# Patient Record
Sex: Male | Born: 1950 | Race: White | Hispanic: No | Marital: Married | Smoking: Never smoker
Health system: Southern US, Community
[De-identification: ages and names within clinical notes are randomized; demographics above are authoritative.]

## PROBLEM LIST (undated history)

## (undated) DIAGNOSIS — D126 Benign neoplasm of colon, unspecified: Secondary | ICD-10-CM

## (undated) DIAGNOSIS — Z8719 Personal history of other diseases of the digestive system: Secondary | ICD-10-CM

## (undated) DIAGNOSIS — E039 Hypothyroidism, unspecified: Secondary | ICD-10-CM

## (undated) DIAGNOSIS — G4733 Obstructive sleep apnea (adult) (pediatric): Secondary | ICD-10-CM

## (undated) DIAGNOSIS — Z9989 Dependence on other enabling machines and devices: Secondary | ICD-10-CM

## (undated) DIAGNOSIS — J309 Allergic rhinitis, unspecified: Secondary | ICD-10-CM

## (undated) DIAGNOSIS — N529 Male erectile dysfunction, unspecified: Secondary | ICD-10-CM

## (undated) DIAGNOSIS — I10 Essential (primary) hypertension: Secondary | ICD-10-CM

## (undated) DIAGNOSIS — K921 Melena: Secondary | ICD-10-CM

## (undated) DIAGNOSIS — G4730 Sleep apnea, unspecified: Secondary | ICD-10-CM

## (undated) DIAGNOSIS — M1990 Unspecified osteoarthritis, unspecified site: Secondary | ICD-10-CM

## (undated) DIAGNOSIS — Z91030 Bee allergy status: Secondary | ICD-10-CM

## (undated) DIAGNOSIS — N390 Urinary tract infection, site not specified: Secondary | ICD-10-CM

## (undated) HISTORY — PX: OTHER SURGICAL HISTORY: SHX169

## (undated) HISTORY — DX: Benign neoplasm of colon, unspecified: D12.6

## (undated) HISTORY — DX: Unspecified osteoarthritis, unspecified site: M19.90

## (undated) HISTORY — DX: Personal history of other diseases of the digestive system: Z87.19

## (undated) HISTORY — DX: Dependence on other enabling machines and devices: Z99.89

## (undated) HISTORY — DX: Essential (primary) hypertension: I10

## (undated) HISTORY — DX: Melena: K92.1

## (undated) HISTORY — PX: HAND SURGERY: SHX662

## (undated) HISTORY — DX: Allergic rhinitis, unspecified: J30.9

## (undated) HISTORY — DX: Obstructive sleep apnea (adult) (pediatric): G47.33

## (undated) HISTORY — DX: Hypothyroidism, unspecified: E03.9

## (undated) HISTORY — DX: Sleep apnea, unspecified: G47.30

## (undated) HISTORY — DX: Male erectile dysfunction, unspecified: N52.9

## (undated) HISTORY — DX: Urinary tract infection, site not specified: N39.0

## (undated) HISTORY — DX: Bee allergy status: Z91.030

---

## 2014-10-26 LAB — PSA: PSA: 1.03

## 2014-10-26 LAB — VITAMIN D 25 HYDROXY (VIT D DEFICIENCY, FRACTURES): Vit D, 25-Hydroxy: 47

## 2014-10-26 LAB — POCT ERYTHROCYTE SEDIMENTATION RATE, NON-AUTOMATED: Sed Rate: 28

## 2016-06-04 LAB — BASIC METABOLIC PANEL
BUN: 10 (ref 4–21)
CREATININE: 0.7 (ref <=1.3)
GLUCOSE: 111
SODIUM: 138 (ref 137–147)

## 2016-06-04 LAB — TSH: TSH: 2.1 (ref <=5.90)

## 2016-06-05 LAB — CBC AND DIFFERENTIAL: WBC: 10.2

## 2017-05-07 ENCOUNTER — Encounter: Payer: Self-pay | Admitting: Family Medicine

## 2017-05-07 ENCOUNTER — Ambulatory Visit (INDEPENDENT_AMBULATORY_CARE_PROVIDER_SITE_OTHER): Payer: Medicare Other | Admitting: Family Medicine

## 2017-05-07 VITALS — BP 132/70 | HR 59 | Temp 98.3°F | Ht 71.0 in | Wt 291.6 lb

## 2017-05-07 DIAGNOSIS — M1990 Unspecified osteoarthritis, unspecified site: Secondary | ICD-10-CM | POA: Diagnosis not present

## 2017-05-07 DIAGNOSIS — N529 Male erectile dysfunction, unspecified: Secondary | ICD-10-CM

## 2017-05-07 DIAGNOSIS — Z91030 Bee allergy status: Secondary | ICD-10-CM

## 2017-05-07 DIAGNOSIS — Z9989 Dependence on other enabling machines and devices: Secondary | ICD-10-CM

## 2017-05-07 DIAGNOSIS — J301 Allergic rhinitis due to pollen: Secondary | ICD-10-CM

## 2017-05-07 DIAGNOSIS — G4733 Obstructive sleep apnea (adult) (pediatric): Secondary | ICD-10-CM | POA: Diagnosis not present

## 2017-05-07 DIAGNOSIS — Z23 Encounter for immunization: Secondary | ICD-10-CM | POA: Diagnosis not present

## 2017-05-07 DIAGNOSIS — M1712 Unilateral primary osteoarthritis, left knee: Secondary | ICD-10-CM | POA: Diagnosis not present

## 2017-05-07 DIAGNOSIS — Z86010 Personal history of colonic polyps: Secondary | ICD-10-CM

## 2017-05-07 DIAGNOSIS — I10 Essential (primary) hypertension: Secondary | ICD-10-CM

## 2017-05-07 DIAGNOSIS — E039 Hypothyroidism, unspecified: Secondary | ICD-10-CM | POA: Insufficient documentation

## 2017-05-07 DIAGNOSIS — H9319 Tinnitus, unspecified ear: Secondary | ICD-10-CM | POA: Insufficient documentation

## 2017-05-07 DIAGNOSIS — J309 Allergic rhinitis, unspecified: Secondary | ICD-10-CM | POA: Insufficient documentation

## 2017-05-07 DIAGNOSIS — G4730 Sleep apnea, unspecified: Secondary | ICD-10-CM | POA: Insufficient documentation

## 2017-05-07 NOTE — Assessment & Plan Note (Signed)
S: has been told multiple times importance of weight loss A/P: we reviewed importance of weight loss once again. Discussed 10 lb weight goal in 6 months. Encouraged need for healthy eating, regular exercise.

## 2017-05-07 NOTE — Patient Instructions (Addendum)
Flu shot before you leave  Sign release of information at the check out desk for last 3 years of labs and office visits from Dr. Jodi Mourning in [place]. In addition- have them specifically request all immunizations, colonoscopy reports, imaging reports.   Get on mychart- send [place] a message with which medicines you need here locally and which pharmacy you want to use  Check in with me by mychart in 3 weeks to see if we have received your records and all info you need. Depending on most recent labs may need repeat labs.   We will see if we can do a nurse visit or if we need you in to see me.   I want you to set a goal of at least 10 lbs off in 6 months when I want to see you back (happy to see you sooner as well).   My 5 to Fitness!  5: fruits and vegetables per day (work on 9 per day if you are at 5) 4: exercise 4-5 times per week for at least 30-45 minutes (walking counts!) 3: meals per day (don't skip breakfast!) 0-1: sweet per day (2 cookies, 1 small cup of ice cream)

## 2017-05-07 NOTE — Assessment & Plan Note (Signed)
S: controlled with regular astelin usage A/P: willing to refill rx if needed at present

## 2017-05-07 NOTE — Assessment & Plan Note (Signed)
S: on very low dose synthroid 25 mcg A/P: get records to see when last TSH checked- likely needs to be rechecked next visit

## 2017-05-07 NOTE — Progress Notes (Signed)
Phone: [phone]  Subjective:  Patient presents today to establish care.  Prior patient in [place]- now moved here. Chief complaint-noted.   See problem oriented charting  The following were reviewed and entered/updated in epic: Past Medical History:  Diagnosis Date  . Allergic rhinitis    astelin  . Arthritis    Hips and shoulders. celebrex 200mg  BID. plans to cut down with medicare costs  . Blood in stool    augmentin  . Erectile dysfunction    cialis 20mg   . Hx of colonic polyp    2014- was told q3 years- needs update. likely adenoma  . Hx of diverticulitis of colon   . Hypertension    lisinopril 10mg   . Hypothyroidism    synthroid 25 mcg  . OSA on CPAP   . UTI (urinary tract infection)    x1- in hospital.   . Yellow jacket sting allergy    epi pens   Patient Active Problem List   Diagnosis Date Noted  . Morbid obesity ([place]) 05/07/2017    Priority: High  . Osteoarthritis of left knee 05/07/2017    Priority: Medium  . OSA on CPAP     Priority: Medium  . Arthritis     Priority: Medium  . Hypertension     Priority: Medium  . Hypothyroidism     Priority: Medium  . Hx of colonic polyp     Priority: Medium  . Tinnitus 05/07/2017    Priority: Low  . Allergic rhinitis     Priority: Low  . Yellow jacket sting allergy     Priority: Low  . Erectile dysfunction     Priority: Low   Past Surgical History:  Procedure Laterality Date  . left foot surgery     fracture related  . left knee surgery     1980    Family History  Problem Relation Age of Onset  . Arthritis Mother   . Colon cancer Mother        late 43s  . Arthritis Sister   . Arthritis Sister   . Heart failure Father   . Parkinson's disease Father   . Colon cancer Maternal Aunt   . Healthy Child     Medications- reviewed and updated Current Outpatient Prescriptions  Medication Sig Dispense Refill  . acetaminophen (TYLENOL) 500 MG tablet Take by mouth as needed.    . ASPIRIN 81 PO Place  81 mg into the right eye daily.    . Azelastine HCl 0.15 % SOLN SPRAY 2 SPRAY BY INTRANASAL ROUTE EVERY DAY IN EACH NOSTRIL  0  . celecoxib (CELEBREX) 200 MG capsule Take 200 mg by mouth 2 (two) times daily.    . Cholecalciferol (GNP VITAMIN D MAXIMUM STRENGTH) 2000 units TABS Take 2,000 Units by mouth 2 (two) times daily.    Marland Kitchen EPINEPHrine (EPIPEN 2-PAK) 0.3 mg/0.3 mL IJ SOAJ injection EpiPen 2-Pak 0.3 mg/0.3 mL injection, auto-injector    . lisinopril (PRINIVIL,ZESTRIL) 10 MG tablet Take 10 mg by mouth daily.  1  . SYNTHROID 25 MCG tablet Take 25 mcg by mouth daily.  0  . tadalafil (CIALIS) 20 MG tablet Take 20 mg by mouth as needed.     Allergies-reviewed and updated Allergies  Allergen Reactions  . Yellow Jacket Venom [Bee Venom] Anaphylaxis  . Augmentin [Amoxicillin-Pot Clavulanate] Other (See Comments)    Bloody stool  . Ibuprofen Other (See Comments)    skaking  . Levaquin [Levofloxacin] Diarrhea  . Oxycodone Nausea And Vomiting  .  Prednisone Other (See Comments)    Altered mental state  . Sudafed [Pseudoephedrine] Rash  . Vioxx [Rofecoxib] Rash    Social History   Social History  . Marital status: Married    Spouse name: N/A  . Number of children: N/A  . Years of education: N/A   Social History Main Topics  . Smoking status: Never Smoker  . Smokeless tobacco: Never Used  . Alcohol use No  . Drug use: No  . Sexual activity: Not Asked   Other Topics Concern  . None   Social History Narrative   Married 1973. 1 child 50 in [place]- will be in [place] still. 75 month old grandbaby 04/2017.       3 years of college. 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     ROS--Full ROS was completed Review of Systems  Constitutional: Negative for chills and fever.  HENT: Positive for tinnitus. Negative for ear discharge and ear pain.   Eyes: Negative for blurred vision and double  vision.  Respiratory: Negative for cough and shortness of breath.   Cardiovascular: Negative for chest pain and palpitations.  Gastrointestinal: Negative for abdominal pain and vomiting.  Genitourinary: Negative for dysuria and urgency.  Musculoskeletal: Positive for joint pain. Negative for back pain and falls.  Skin: Negative for itching and rash.  Neurological: Negative for dizziness and headaches.  Endo/Heme/Allergies: Negative for polydipsia. Does not bruise/bleed easily.  Psychiatric/Behavioral: Negative for hallucinations and substance abuse.   Objective: BP 132/70   Pulse (!) 59   Temp 98.3 F (36.8 C) (Oral)   Ht 5\' 11"  (1.803 m)   Wt 291 lb 9.6 oz (132.3 kg)   SpO2 95%   BMI 40.67 kg/m  Gen: NAD, resting comfortably HEENT: Mucous membranes are moist. Oropharynx normal. TM normal. Eyes: sclera and lids normal, PERRLA Neck: no thyromegaly, no cervical lymphadenopathy CV: RRR no murmurs rubs or gallops. Not bradycardic on my exam Lungs: CTAB no crackles, wheeze, rhonchi Abdomen: soft/nontender/nondistended/normal bowel sounds. No rebound or guarding. 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 Compliant with cpap A/P: due to central etiology- consider referral to Dr. Brett Fairy of neurology locally   Arthritis S: arthritis in Knees, Hips and shoulders. celebrex 200mg  BID. 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  BP Readings from Last 3 Encounters:  05/07/17 132/70  A/P: We discussed blood pressure goal of <140/90. Continue current meds:  Willing to refill   Hypothyroidism S: on very low dose synthroid 25 mcg A/P: get records to see when last TSH checked- likely needs to be rechecked next visit  Hx of colonic polyp 2014- was told q3 years- needs update now. likely adenoma- need records  Morbid obesity ([place]) S: has been told multiple times importance of weight loss A/P: we reviewed  importance of weight loss once again. Discussed 10 lb weight goal in 6 months. Encouraged need for healthy eating, regular exercise.    Future Appointments Date Time Provider [place]  06/16/2017 2:30 PM Marin Olp, MD [facility] None  Welcome to medicare exam.    Orders Placed This Encounter  Procedures  . Flu vaccine HIGH DOSE PF    Meds ordered this encounter  Medications  . ASPIRIN 81 PO    Sig: Take 81 mg by mouth daily.   Marland Kitchen lisinopril (PRINIVIL,ZESTRIL) 10 MG tablet    Sig: Take 10 mg by mouth daily.    Refill:  1  . SYNTHROID 25 MCG tablet    Sig: Take 25 mcg by mouth daily.    Refill:  0  . celecoxib (CELEBREX) 200 MG capsule    Sig: Take 200 mg by mouth 2 (two) times daily.  . Azelastine HCl 0.15 % SOLN    Sig: SPRAY 2 SPRAY BY INTRANASAL ROUTE EVERY DAY IN EACH NOSTRIL    Refill:  0  . EPINEPHrine (EPIPEN 2-PAK) 0.3 mg/0.3 mL IJ SOAJ injection    Sig: EpiPen 2-Pak 0.3 mg/0.3 mL injection, auto-injector  . tadalafil (CIALIS) 20 MG tablet    Sig: Take 20 mg by mouth as needed.  . Cholecalciferol (GNP VITAMIN D MAXIMUM STRENGTH) 2000 units TABS    Sig: Take 2,000 Units by mouth 2 (two) times daily.  Marland Kitchen acetaminophen (TYLENOL) 500 MG tablet    Sig: Take by mouth as needed.    Return precautions advised. [name], MD

## 2017-05-07 NOTE — Assessment & Plan Note (Signed)
2014- was told q3 years- needs update now. likely adenoma- need records

## 2017-05-07 NOTE — Assessment & Plan Note (Signed)
S: controlled on lisinopril 10mg .  BP Readings from Last 3 Encounters:  05/07/17 132/70  A/P: We discussed blood pressure goal of <140/90. Continue current meds:  Willing to refill

## 2017-05-07 NOTE — Assessment & Plan Note (Addendum)
S: arthritis in Knees, Hips and shoulders. celebrex 200mg  BID. plans to cut down with medicare costs- to less frequent use A/P: willing to refill medication- may need to discuss using traditional nsaids instead- or perhaps even voltaren gel given does have cardiac risk factors

## 2017-05-07 NOTE — Assessment & Plan Note (Signed)
S: severe reactions to yellow jackets in past A/P: willing to refill epi pens

## 2017-05-07 NOTE — Assessment & Plan Note (Signed)
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 Compliant with cpap A/P: due to central etiology- consider referral to Dr. Brett Fairy of neurology locally

## 2017-05-28 ENCOUNTER — Telehealth: Payer: Self-pay | Admitting: Family Medicine

## 2017-05-28 NOTE — Telephone Encounter (Signed)
ROI faxed to [facility]

## 2017-06-15 NOTE — Telephone Encounter (Signed)
No records from [facility]

## 2017-06-16 ENCOUNTER — Ambulatory Visit (INDEPENDENT_AMBULATORY_CARE_PROVIDER_SITE_OTHER): Payer: Medicare Other | Admitting: Family Medicine

## 2017-06-16 ENCOUNTER — Encounter: Payer: Self-pay | Admitting: Family Medicine

## 2017-06-16 VITALS — BP 142/72 | HR 60 | Temp 97.7°F | Ht 71.0 in | Wt 292.6 lb

## 2017-06-16 DIAGNOSIS — Z0000 Encounter for general adult medical examination without abnormal findings: Secondary | ICD-10-CM

## 2017-06-16 DIAGNOSIS — N401 Enlarged prostate with lower urinary tract symptoms: Secondary | ICD-10-CM

## 2017-06-16 DIAGNOSIS — Z23 Encounter for immunization: Secondary | ICD-10-CM

## 2017-06-16 DIAGNOSIS — G4731 Primary central sleep apnea: Secondary | ICD-10-CM

## 2017-06-16 DIAGNOSIS — I10 Essential (primary) hypertension: Secondary | ICD-10-CM

## 2017-06-16 DIAGNOSIS — G4733 Obstructive sleep apnea (adult) (pediatric): Secondary | ICD-10-CM

## 2017-06-16 DIAGNOSIS — Z125 Encounter for screening for malignant neoplasm of prostate: Secondary | ICD-10-CM | POA: Diagnosis not present

## 2017-06-16 DIAGNOSIS — Z1159 Encounter for screening for other viral diseases: Secondary | ICD-10-CM | POA: Diagnosis not present

## 2017-06-16 DIAGNOSIS — E039 Hypothyroidism, unspecified: Secondary | ICD-10-CM

## 2017-06-16 DIAGNOSIS — Z8042 Family history of malignant neoplasm of prostate: Secondary | ICD-10-CM | POA: Diagnosis not present

## 2017-06-16 DIAGNOSIS — Z800 Family history of malignant neoplasm of digestive organs: Secondary | ICD-10-CM | POA: Diagnosis not present

## 2017-06-16 DIAGNOSIS — Z86010 Personal history of colonic polyps: Secondary | ICD-10-CM

## 2017-06-16 DIAGNOSIS — R351 Nocturia: Secondary | ICD-10-CM

## 2017-06-16 DIAGNOSIS — E785 Hyperlipidemia, unspecified: Secondary | ICD-10-CM

## 2017-06-16 DIAGNOSIS — Z9989 Dependence on other enabling machines and devices: Secondary | ICD-10-CM

## 2017-06-16 DIAGNOSIS — M1712 Unilateral primary osteoarthritis, left knee: Secondary | ICD-10-CM

## 2017-06-16 MED ORDER — TADALAFIL 20 MG PO TABS: 20.0000 mg | ORAL_TABLET | ORAL | 5 refills | Status: DC | PRN

## 2017-06-16 MED ORDER — EPINEPHRINE 0.3 MG/0.3ML IJ SOAJ: INTRAMUSCULAR | 1 refills | Status: DC

## 2017-06-16 MED ORDER — SYNTHROID 25 MCG PO TABS: 25.0000 ug | ORAL_TABLET | Freq: Every day | ORAL | 3 refills | Status: DC

## 2017-06-16 NOTE — Progress Notes (Addendum)
Phone: [phone]  Subjective:  Patient presents today for their Welcome to Medicare Exam    Preventive Screening-Counseling & Management  Vision screen:   Visual Acuity Screening   Right eye Left eye Both eyes  Without correction:     With correction: 20/20 20/20 20/20     Advanced directives: Discussed getting this set up -both HCPOA and living will. Full code per discussion.   Smoking Status: Never Smoker Second Hand Smoking status: No smokers in home  Risk Factors Regular exercise: 2-3 x a week walking or mowing yard. Discussed goal 150 minutes a week- biking would be a good idea for him given knee pain issues Diet: discussed  Morbid obesity based on BMi and importance of weight loss. Set 10 lb goal minimum within 6 months  Fall Risk: None  Fall Risk  06/16/2017 05/07/2017  Falls in the past year? No No  Opioid use history:  no long term opioids use. Sparing acute issues in past  Cardiac risk factors:  advanced age (older than 94 for men, 56 for women)  Hyperlipidemia - need to evaluate with lipid panel No diabetes.  Family History: heart failure in history but no known history of ischemic event Hypertension- controlled in past on lisinopril   Depression Screen None. PHQ2 0  Depression screen [facility] 2/9 06/16/2017 05/07/2017  Decreased Interest 0 0  Down, Depressed, Hopeless 0 0  PHQ - 2 Score 0 0    Activities of Daily Living Independent ADLs and IADLs   Hearing Difficulties: -patient endorses. we discussed need to get hearing tested, he wants to hold off for now  Cognitive Testing No reported trouble.    Normal 3 word recall  List the Names of Other Physician/Practitioners you currently use: -[facility] care -prior [facility]  Immunization History  Administered Date(s) Administered  . Influenza, High Dose Seasonal PF 05/07/2017  . Pneumococcal Conjugate-13 06/16/2017   Required Immunizations needed today discussed need for  prevnar 13 today. Would advise Tdap at pharmacy  Has been to ER in last year- last November for UTI when out of town in [place]  Screening tests- up to date 1. Discussed HCV and HIV screen. Opts in hcv, opts out HIV bloodwork as gave blood 10 years ago.  2. Need records from Dr. Michaela Corner for colonoscopy still- was due 2017 for colonoscopy- refer back today due to family history in mother and q5 years recommended 3. Prostate cancer screening- discussed doing this and he opts in with dad with prostate cancer 4. Skin cancer screening -saw dermatology 5 years ago. advised regular sunscreen use. Denies worrisome, changing, or new skin lesions.   ROS- No pertinent positives discovered in course of welcome to medicare exam ROS pertinent- No chest pain. No headache or blurry vision. Had leg pain on celebrex twice a day- better on one- mild shortness of breath on 2 a day and improved down to 1 a day celebrex  The following were reviewed and entered/updated in epic: Past Medical History:  Diagnosis Date  . Allergic rhinitis    astelin  . Arthritis    Hips and shoulders. celebrex 200mg  BID. plans to cut down with medicare costs  . Blood in stool    augmentin  . Erectile dysfunction    cialis 20mg   . Hx of colonic polyp    2014- was told q3 years- needs update. likely adenoma  . Hx of diverticulitis of colon   . Hypertension    lisinopril 10mg   . Hypothyroidism  synthroid 25 mcg  . OSA on CPAP   . UTI (urinary tract infection)    x1- in hospital.   . Yellow jacket sting allergy    epi pens   Patient Active Problem List   Diagnosis Date Noted  . Morbid obesity ([place]) 05/07/2017    Priority: High  . BPH associated with nocturia 06/16/2017    Priority: Medium  . Osteoarthritis of left knee 05/07/2017    Priority: Medium  . OSA on CPAP     Priority: Medium  . Arthritis     Priority: Medium  . Hypertension     Priority: Medium  . Hypothyroidism     Priority: Medium  . Hx of colonic polyp      Priority: Medium  . Tinnitus 05/07/2017    Priority: Low  . Allergic rhinitis     Priority: Low  . Yellow jacket sting allergy     Priority: Low  . Erectile dysfunction     Priority: Low   Past Surgical History:  Procedure Laterality Date  . left foot surgery     fracture related  . left knee surgery     1980    Family History  Problem Relation Age of Onset  . Arthritis Mother   . Colon cancer Mother        late 32s  . Arthritis Sister   . Arthritis Sister   . Heart failure Father   . Parkinson's disease Father   . Colon cancer Maternal Aunt   . Healthy Child     Medications- reviewed and updated Current Outpatient Medications  Medication Sig Dispense Refill  . acetaminophen (TYLENOL) 500 MG tablet Take by mouth as needed.    . ASPIRIN 81 PO Take 81 mg by mouth daily.     . Azelastine HCl 0.15 % SOLN SPRAY 2 SPRAY BY INTRANASAL ROUTE EVERY DAY IN EACH NOSTRIL  0  . celecoxib (CELEBREX) 200 MG capsule Take 200 mg by mouth daily.     . Cholecalciferol (GNP VITAMIN D MAXIMUM STRENGTH) 2000 units TABS Take 2,000 Units by mouth 2 (two) times daily.    Marland Kitchen EPINEPHrine (EPIPEN 2-PAK) 0.3 mg/0.3 mL IJ SOAJ injection EpiPen 2-Pak 0.3 mg/0.3 mL injection, auto-injector 2 Device 1  . lisinopril (PRINIVIL,ZESTRIL) 10 MG tablet Take 10 mg by mouth daily.  1  . SYNTHROID 25 MCG tablet Take 1 tablet (25 mcg total) by mouth daily. 90 tablet 3  . tadalafil (CIALIS) 20 MG tablet Take 1 tablet (20 mg total) by mouth as needed. 10 tablet 5   No current facility-administered medications for this visit.     Allergies-reviewed and updated Allergies  Allergen Reactions  . Yellow Jacket Venom [Bee Venom] Anaphylaxis  . Augmentin [Amoxicillin-Pot Clavulanate] Other (See Comments)    Bloody stool  . Ibuprofen Other (See Comments)    skaking  . Levaquin [Levofloxacin] Diarrhea  . Oxycodone Nausea And Vomiting  . Prednisone Other (See Comments)    Altered mental state  . Sudafed  [Pseudoephedrine] Rash  . Vioxx [Rofecoxib] Rash    Social History   Socioeconomic History  . Marital status: Married    Spouse name: None  . Number of children: None  . Years of education: None  . Highest education level: None  Social Needs  . Financial resource strain: None  . Food insecurity - worry: None  . Food insecurity - inability: None  . Transportation needs - medical: None  . Transportation needs - non-medical: None  Occupational History  . None  Tobacco Use  . Smoking status: Never Smoker  . Smokeless tobacco: Never Used  Substance and Sexual Activity  . Alcohol use: No  . Drug use: No  . Sexual activity: None  Other Topics Concern  . None  Social History Narrative   Married 1973. 1 child 60 in [place]- will be in [place] still. 33 month old grandbaby 04/2017.       3 years of college. 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     Objective: BP (!) 142/72   Pulse 60   Temp 97.7 F (36.5 C) (Oral)   Ht 5\' 11"  (1.803 m)   Wt 292 lb 9.6 oz (132.7 kg)   SpO2 98%   BMI 40.81 kg/m  Gen: NAD, resting comfortably HEENT: Mucous membranes are moist. Oropharynx normal Neck: no thyromegaly CV: RRR no murmurs rubs or gallops Lungs: CTAB no crackles, wheeze, rhonchi Abdomen: soft/nontender/nondistended/normal bowel sounds. No rebound or guarding. Morbid obesity Ext: no edema Skin: warm, dry Neuro: grossly normal, moves all extremities, PERRLA Rectal: normal tone, diffusely enlarged prostate, no masses or tenderness   EKG: sinus bradycardia with rate of 53, pr slightly prolonged right at 200 msec, qrs and qt interval normal. No st or t wave changes- poor lead placement in v1 though. No  ischemic changes.   Assessment/Plan:  Welcome to Medicare exam completed- discussed recommended screenings anddocumented any personalized health advice and referrals for preventive  counseling. See AVS as well which was given to patient.   Status of chronic or acute concerns   Continues astelin for allergies. Refill if needed  a1c not covered under obesity- will see if hyperglycemia and get a1c next visit  OSA on CPAP Refer to Dr. Brett Fairy of [facility] for central sleep apnea. Supplies worn out and fatigue worsening. Will enter as urgent referral per patient request-really starting to struggle with fatigue. He is not sure if we can just write for the supplies- some confusion between care in [place] and prior [place] md and trouble getting records and he thinks he may need new sleep study  Osteoarthritis of left knee Down to 200mg  once a day of celebrex- still helping with arthritis in knees, hips and shoulders  Hypothyroidism Hypothyroidism- on levothyroxine 25 mcg- will get tsh  Hx of colonic polyp 2014 colonoscopy- was told q3 years- refer today as was told due late 2017. Try to get records again- ROI signed  Hypertension Monitor BP- 142/72 on repeat at least at [facility] goal but would prefer <140/90 and was there last time BP Readings from Last 3 Encounters:  06/16/17 (!) 142/72  05/07/17 132/70  continue lisinopril 10mg - follow up in 1 month and titrate up if Bp remains elevated EKG done- shows sinus bradycardia  BPH associated with nocturia bph with nocturia. Nocturia once a night. Will evaluate with PSA  Future Appointments  Date Time Provider [place]  06/23/2017 10:30 AM [facility] LAB [facility]  07/23/2017  3:30 PM Hunter, Brayton Mars, MD [facility]   Orders Placed This Encounter  Procedures  . Pneumococcal conjugate vaccine 13-valent IM  . CBC with Differential/Platelet    Standing Status:   Future    Standing Expiration Date:   06/16/2018  . Comprehensive metabolic panel    [place]    Standing Status:   Future    Standing Expiration Date:  06/16/2018  . Hepatitis C antibody    Standing Status:   Future    Standing Expiration  Date:   06/16/2018  . Lipid panel    Standing Status:   Future    Standing Expiration Date:   06/16/2018  . TSH    Standing Status:   Future    Standing Expiration Date:   06/16/2018  . PSA    Standing Status:   Future    Standing Expiration Date:   06/16/2018  . Ambulatory referral to Gastroenterology    Referral Priority:   Routine    Referral Type:   Consultation    Referral Reason:   Specialty Services Required    Number of Visits Requested:   1  . Ambulatory referral to Neurology    Referral Priority:   Urgent    Referral Type:   Consultation    Referral Reason:   Specialty Services Required    Referred to Provider:   Larey Seat, MD    Requested Specialty:   Neurology    Number of Visits Requested:   1  . POCT Urinalysis Dipstick (Automated)    Standing Status:   Future    Standing Expiration Date:   07/16/2017  . EKG 12-Lead    Order Specific Question:   Where should this test be performed    Answer:   Other    Meds ordered this encounter  Medications  . SYNTHROID 25 MCG tablet    Sig: Take 1 tablet (25 mcg total) by mouth daily.    Dispense:  90 tablet    Refill:  3  . tadalafil (CIALIS) 20 MG tablet    Sig: Take 1 tablet (20 mg total) by mouth as needed.    Dispense:  10 tablet    Refill:  5  . EPINEPHrine (EPIPEN 2-PAK) 0.3 mg/0.3 mL IJ SOAJ injection    Sig: EpiPen 2-Pak 0.3 mg/0.3 mL injection, auto-injector    Dispense:  2 Device    Refill:  1    Return precautions advised. [name], MD

## 2017-06-16 NOTE — Assessment & Plan Note (Addendum)
Monitor BP- 142/72 on repeat at least at [facility] goal but would prefer <140/90 and was there last time BP Readings from Last 3 Encounters:  06/16/17 (!) 142/72  05/07/17 132/70  continue lisinopril 10mg - follow up in 1 month and titrate up if Bp remains elevated EKG done- shows sinus bradycardia

## 2017-06-16 NOTE — Assessment & Plan Note (Signed)
bph with nocturia. Nocturia once a night. Will evaluate with PSA

## 2017-06-16 NOTE — Patient Instructions (Addendum)
  Mr. [facility] , Thank you for taking time to come for your Medicare Wellness Visit. I appreciate your ongoing commitment to your health goals. Please review the following plan we discussed and let me know if I can assist you in the future.   These are the goals we discussed: 1. prevnar 13 today (need final pneumonia shot next year). Would advise Tdap at pharmacy (good for 10 years) 2.  Set a goal of biking 30 minutes 5 days a week- might have to be indoors especially with the winter 3. Goal at least 10 lbs weight loss within 6 months 4. Sign release of information at the check out desk for last colonoscopy from Dr. Michaela Corner at [facility] 5. Schedule a lab visit at the check out desk within 2 weeks. Return for future fasting labs meaning nothing but water after midnight please. Ok to take your medications with water.  6. Would like to see you back in 1 month to recheck blood pressure given elevated today as would need to adjust if elevated BP Readings from Last 3 Encounters:  06/16/17 (!) 142/72  05/07/17 132/70    This is a list of the screening recommended for you and due dates:  Health Maintenance  Topic Date Due  .  Hepatitis C: One time screening is recommended by Center for Disease Control  (CDC) for  adults born from [birth year] through 1965.   [birth year]  . HIV Screening  06/28/1966  . Tetanus Vaccine  06/28/1970  . Colon Cancer Screening  06/28/2001  . Pneumonia vaccines (1 of 2 - PCV13) 06/28/2016  . Flu Shot  Completed

## 2017-06-16 NOTE — Assessment & Plan Note (Signed)
Hypothyroidism- on levothyroxine 25 mcg- will get tsh

## 2017-06-16 NOTE — Assessment & Plan Note (Signed)
Down to 200mg  once a day of celebrex- still helping with arthritis in knees, hips and shoulders

## 2017-06-16 NOTE — Assessment & Plan Note (Signed)
Refer to Dr. Brett Fairy of [facility] for central sleep apnea. Supplies worn out and fatigue worsening. Will enter as urgent referral per patient request-really starting to struggle with fatigue. He is not sure if we can just write for the supplies- some confusion between care in [place] and prior [place] md and trouble getting records and he thinks he may need new sleep study

## 2017-06-16 NOTE — Assessment & Plan Note (Signed)
2014 colonoscopy- was told q3 years- refer today as was told due late 2017. Try to get records again- ROI signed

## 2017-06-23 ENCOUNTER — Other Ambulatory Visit (INDEPENDENT_AMBULATORY_CARE_PROVIDER_SITE_OTHER): Payer: Medicare Other

## 2017-06-23 DIAGNOSIS — E039 Hypothyroidism, unspecified: Secondary | ICD-10-CM

## 2017-06-23 DIAGNOSIS — N401 Enlarged prostate with lower urinary tract symptoms: Secondary | ICD-10-CM | POA: Diagnosis not present

## 2017-06-23 DIAGNOSIS — Z1159 Encounter for screening for other viral diseases: Secondary | ICD-10-CM

## 2017-06-23 DIAGNOSIS — I10 Essential (primary) hypertension: Secondary | ICD-10-CM | POA: Diagnosis not present

## 2017-06-23 DIAGNOSIS — R351 Nocturia: Secondary | ICD-10-CM | POA: Diagnosis not present

## 2017-06-23 DIAGNOSIS — E785 Hyperlipidemia, unspecified: Secondary | ICD-10-CM | POA: Insufficient documentation

## 2017-06-23 LAB — POC URINALSYSI DIPSTICK (AUTOMATED)
Bilirubin, UA: NEGATIVE
GLUCOSE UA: NEGATIVE
Ketones, UA: NEGATIVE
Leukocytes, UA: NEGATIVE
NITRITE UA: NEGATIVE
Protein, UA: NEGATIVE
RBC UA: NEGATIVE
Spec Grav, UA: >=1.03 — AB (ref 1.010–1.025)
UROBILINOGEN UA: 0.2 U/dL
pH, UA: 5.5 (ref 5.0–8.0)

## 2017-06-23 LAB — COMPREHENSIVE METABOLIC PANEL
ALBUMIN: 4.8 g/dL (ref 3.5–5.2)
ALT: 46 U/L (ref 0–53)
AST: 50 U/L — ABNORMAL HIGH (ref 0–37)
Alkaline Phosphatase: 53 U/L (ref 39–117)
BILIRUBIN TOTAL: 0.9 mg/dL (ref 0.2–1.2)
BUN: 15 mg/dL (ref 6–23)
CALCIUM: 10.5 mg/dL (ref 8.4–10.5)
CO2: 26 meq/L (ref 19–32)
Chloride: 103 mEq/L (ref 96–112)
Creatinine, Ser: 0.86 mg/dL (ref 0.40–1.50)
GFR: 94.57 mL/min (ref >60.00)
Glucose, Bld: 90 mg/dL (ref 70–99)
Potassium: 4.4 mEq/L (ref 3.5–5.1)
Sodium: 138 mEq/L (ref 135–145)
Total Protein: 7.5 g/dL (ref 6.0–8.3)

## 2017-06-23 LAB — CBC WITH DIFFERENTIAL/PLATELET
BASOS ABS: 0.1 10*3/uL (ref 0.0–0.1)
Basophils Relative: 0.9 % (ref 0.0–3.0)
Eosinophils Absolute: 0.1 10*3/uL (ref 0.0–0.7)
Eosinophils Relative: 1.7 % (ref 0.0–5.0)
HCT: 46.6 % (ref 39.0–52.0)
Hemoglobin: 15.9 g/dL (ref 13.0–17.0)
LYMPHS PCT: 30.8 % (ref 12.0–46.0)
Lymphs Abs: 2.4 10*3/uL (ref 0.7–4.0)
MCHC: 34.1 g/dL (ref 30.0–36.0)
MCV: 100.6 fl — AB (ref 78.0–100.0)
MONOS PCT: 8.8 % (ref 3.0–12.0)
Monocytes Absolute: 0.7 10*3/uL (ref 0.1–1.0)
NEUTROS ABS: 4.5 10*3/uL (ref 1.4–7.7)
NEUTROS PCT: 57.8 % (ref 43.0–77.0)
PLATELETS: 235 10*3/uL (ref 150.0–400.0)
RBC: 4.63 Mil/uL (ref 4.22–5.81)
RDW: 13.2 % (ref 11.5–15.5)
WBC: 7.8 10*3/uL (ref 4.0–10.5)

## 2017-06-23 LAB — LDL CHOLESTEROL, DIRECT: Direct LDL: 126 mg/dL

## 2017-06-23 LAB — LIPID PANEL
CHOL/HDL RATIO: 5
CHOLESTEROL: 216 mg/dL — AB (ref 0–200)
HDL: 42.8 mg/dL (ref >39.00)
NonHDL: 172.86
TRIGLYCERIDES: 270 mg/dL — AB (ref 0.0–149.0)
VLDL: 54 mg/dL — AB (ref 0.0–40.0)

## 2017-06-23 LAB — TSH: TSH: 2.45 u[IU]/mL (ref 0.35–4.50)

## 2017-06-23 LAB — PSA: PSA: 2.49 ng/mL (ref 0.10–4.00)

## 2017-06-24 LAB — HEPATITIS C ANTIBODY
HEP C AB: NONREACTIVE
SIGNAL TO CUT-OFF: 0.01 (ref <1.00)

## 2017-06-30 ENCOUNTER — Encounter: Payer: Self-pay | Admitting: Family Medicine

## 2017-07-01 ENCOUNTER — Other Ambulatory Visit: Payer: Self-pay

## 2017-07-01 MED ORDER — ATORVASTATIN CALCIUM 20 MG PO TABS: 20.0000 mg | ORAL_TABLET | Freq: Every day | ORAL | 3 refills | Status: DC

## 2017-07-06 ENCOUNTER — Encounter: Payer: Self-pay | Admitting: Physical Therapy

## 2017-07-07 ENCOUNTER — Encounter: Payer: Self-pay | Admitting: Physical Therapy

## 2017-07-10 ENCOUNTER — Encounter: Payer: Self-pay | Admitting: Physical Therapy

## 2017-07-11 ENCOUNTER — Encounter: Payer: Self-pay | Admitting: Family Medicine

## 2017-07-14 ENCOUNTER — Encounter: Payer: Self-pay | Admitting: Neurology

## 2017-07-15 ENCOUNTER — Encounter: Payer: Self-pay | Admitting: Neurology

## 2017-07-15 ENCOUNTER — Ambulatory Visit (INDEPENDENT_AMBULATORY_CARE_PROVIDER_SITE_OTHER): Payer: Medicare Other | Admitting: Neurology

## 2017-07-15 VITALS — BP 158/79 | HR 54 | Ht 71.0 in | Wt 290.0 lb

## 2017-07-15 DIAGNOSIS — G4730 Sleep apnea, unspecified: Secondary | ICD-10-CM | POA: Diagnosis not present

## 2017-07-15 DIAGNOSIS — G4731 Primary central sleep apnea: Secondary | ICD-10-CM | POA: Insufficient documentation

## 2017-07-15 DIAGNOSIS — G4710 Hypersomnia, unspecified: Secondary | ICD-10-CM

## 2017-07-15 DIAGNOSIS — Z7189 Other specified counseling: Secondary | ICD-10-CM | POA: Diagnosis not present

## 2017-07-15 NOTE — Progress Notes (Signed)
SLEEP MEDICINE CLINIC   Provider:  Larey Farrell, M D  Primary Care Physician:  Craig Olp, MD   Referring Provider: Marin Olp, MD   Chief Complaint  Patient presents with  . Central Sleep Apnea    He is here with his wife, Craig Farrell.  He has been using BIPAP for the last 5-6 years.  His last sleep study was 02/06/12.      HPI:  Craig Farrell is a 66 y.o. male , seen here as revisit from Dr. Yong Channel for a re- evaluation,  [name] has an amazing story to tell.   Apparently he had sleep studies and sleep related medical follow-ups in different locations in a different state.   He had an evaluation of sleep apnea at [facility] sleep-  And was placed in a trailer, where he could not sleep.  He was seen later  by [facility] on 06 February 2012 underwent a CPAP bilevel and ASV titration.  CPAP was initiated at 4 cmH2O after the patient had a documented AHI of 81/h he was initiated on 9 cmH2O pressure but developed central apneas.  His very first polysomnogram quoted by Dr. Thedore Mins was from 06 July 2009.  Apparently CPAP did not help with the resolution of apnea the patient was therefore switched to a bilevel first at 8/4 centimeter then a 10/4 centimeter and then a backup rate of 10 bpm was employed, again central apneas emerged the patient was finally titrated to ASV.  The diagnosis was now complex sleep apnea which improved on ASV to an AHI of 8/hr.  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  Minimum pressure support 4 cm, maximum pressure support 14 cm with a ResMed Swift fracture nasal seal interface.  In the meantime he had moved to [place] where his ASV machine was not interrogated.   He was actually not treated, he was just given a new S10 machine, which has not been set to be usable for him.   He has basically of brand new machine.  He did receive 1 refill for his CPAP supplies.  He used them with his S9 machine.The supplies  are 33-year-old.  Chief complaint according to patient : The patient needs to establish sleep care but he also needs supplies.  His last supplies are over a year old, download from his ASV machine which still works excellently shows 100% compliance, the patient uses it on average 9 hours and 24 minutes at night, expiratory pressure of 4 cmH2O minimum pressure support 4 cmH2O maximum pressure support 14 cmH2O residual AHI is 0.8/hr. He has some high air leaks but these are related to tubing and interface and facial hair.  Sleep habits are as follows: Bedtime is usually between 9 and 10 PM, he usually reads before he retreats to bed he does not have any trouble falling asleep, staying asleep.  He has usually one bathroom break around 2 AM, some nights that will be a second between 4 and 5 AM.  Patient sleeps on his right side only with 2 pillows for head support. His bedroom is cool, quiet and dark.  His wife has noticed significant air leaks that she can sense and she has noted that he can now snore through the CPAP indicating air seal is broken. The couple rises usually at 7 AM, they are keeping the 69 months old granddaughter.   Sleep medical history and family sleep history: The patient's  father had sleep apnea and actually underwent a UPPP.  Two uncles also were diagnosed with sleep apnea. [name] also has sleep apnea and uses a sleep CPAP.   Social history: Married, one daughter, one grandchildren.    Non smoker, non drinker, caffeine : 2 cups in AM, caffeine free coke and tea.   Review of Systems: Out of a complete 14 system review, the patient complains of only the following symptoms, and all other reviewed systems are negative. snoring through ASV, needs new supplies.    Epworth score 8/ hr. , Fatigue severity score 46/ hr. , depression score N/a    Social History   Socioeconomic History  . Marital status: Married    Spouse name: Not on file  . Number of children: 1  . Years of  education: 26  . Highest education level: Not on file  Social Needs  . Financial resource strain: Not on file  . Food insecurity - worry: Not on file  . Food insecurity - inability: Not on file  . Transportation needs - medical: Not on file  . Transportation needs - non-medical: Not on file  Occupational History  . Not on file  Tobacco Use  . Smoking status: Never Smoker  . Smokeless tobacco: Never Used  Substance and Sexual Activity  . Alcohol use: No  . Drug use: No  . Sexual activity: Not on file  Other Topics Concern  . Not on file  Social History Narrative   Married 1973. 1 child 17 in [place]- will be in [place] still. 65 month old grandbaby 04/2017.       3 years of college. 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       Two cups caffeine daily.      Right-handed.    Family History  Problem Relation Age of Onset  . Arthritis Mother   . Colon cancer Mother        late 26s  . Arthritis Sister   . Arthritis Sister   . Heart failure Father   . Parkinson's disease Father   . Dementia Father   . Colon cancer Maternal Aunt   . Healthy Child     Past Medical History:  Diagnosis Date  . Allergic rhinitis    astelin  . Arthritis    Hips and shoulders. celebrex 200mg  BID. plans to cut down with medicare costs  . Blood in stool    augmentin  . Erectile dysfunction    cialis 20mg   . Hx of colonic polyp    2014- was told q3 years- needs update. likely adenoma  . Hx of diverticulitis of colon   . Hypertension    lisinopril 10mg   . Hypothyroidism    synthroid 25 mcg  . OSA on CPAP   . UTI (urinary tract infection)    x1- in hospital.   . Yellow jacket sting allergy    epi pens    Past Surgical History:  Procedure Laterality Date  . HAND SURGERY Right   . left foot surgery     fracture related  . left knee surgery     1980    Current Outpatient Medications    Medication Sig Dispense Refill  . acetaminophen (TYLENOL) 500 MG tablet Take by mouth as needed.    . ASPIRIN 81 PO Take 81 mg by mouth daily.     Marland Kitchen  atorvastatin (LIPITOR) 20 MG tablet Take 1 tablet (20 mg total) by mouth daily. 90 tablet 3  . Azelastine HCl 0.15 % SOLN SPRAY 2 SPRAY BY INTRANASAL ROUTE EVERY DAY IN EACH NOSTRIL  0  . celecoxib (CELEBREX) 200 MG capsule Take 200 mg by mouth daily.     . Cholecalciferol (GNP VITAMIN D MAXIMUM STRENGTH) 2000 units TABS Take 2,000 Units by mouth 2 (two) times daily.    Marland Kitchen EPINEPHrine (EPIPEN 2-PAK) 0.3 mg/0.3 mL IJ SOAJ injection EpiPen 2-Pak 0.3 mg/0.3 mL injection, auto-injector 2 Device 1  . lisinopril (PRINIVIL,ZESTRIL) 10 MG tablet Take 10 mg by mouth daily.  1  . SYNTHROID 25 MCG tablet Take 1 tablet (25 mcg total) by mouth daily. 90 tablet 3  . tadalafil (CIALIS) 20 MG tablet Take 1 tablet (20 mg total) by mouth as needed. 10 tablet 5   No current facility-administered medications for this visit.     Allergies as of 07/15/2017 - Review Complete 07/15/2017  Allergen Reaction Noted  . Yellow jacket venom [bee venom] Anaphylaxis 05/07/2017  . Augmentin [amoxicillin-pot clavulanate] Other (See Comments) 05/07/2017  . Ibuprofen Other (See Comments) 05/07/2017  . Levaquin [levofloxacin] Diarrhea 05/07/2017  . Oxycodone Nausea And Vomiting 05/07/2017  . Prednisone Other (See Comments) 05/07/2017  . Sudafed [pseudoephedrine] Rash 05/07/2017  . Vioxx [rofecoxib] Rash 05/07/2017    Vitals: BP (!) 158/79   Pulse (!) 54   Ht 5\' 11"  (1.803 m)   Wt 290 lb (131.5 kg)   BMI 40.45 kg/m  Last Weight:  Wt Readings from Last 1 Encounters:  07/15/17 290 lb (131.5 kg)   OVF:IEPP mass index is 40.45 kg/m.     Last Height:   Ht Readings from Last 1 Encounters:  07/15/17 5\' 11"  (1.803 m)    Physical exam:  General: The patient is awake, alert and appears not in acute distress. The patient is well groomed. Head: Normocephalic, atraumatic.  Neck is supple. Mallampati 2-I was able to visualize the uvula, but the patient is a very low soft palate, narrow angle, and lateral pillows- this is a crowded upper airway. neck circumference: 19.5 . Nasal airflow congested, TMJ is evident . Retrognathia is  Not seen.  Cardiovascular:  Regular rate and rhythm, without  murmurs or carotid bruit, and without distended neck veins. Respiratory: Lungs are clear to auscultation. Skin:  Without evidence of edema, or rash Trunk: BMI is 40.5  Neurologic exam : The patient is awake and alert, oriented to place and time.   Attention span & concentration ability appears normal.  Speech is fluent,  without dysarthria, dysphonia or aphasia.  Mood and affect are appropriate.  Cranial nerves: Pupils are equal and briskly reactive to light. Funduscopic exam without evidence of pallor or edema.  Extraocular movements  in vertical and horizontal planes intact and without nystagmus. Visual fields by finger perimetry are intact. Hearing to finger rub intact.   Facial sensation intact to fine touch.  Facial motor strength is symmetric and tongue and uvula move midline. Shoulder shrug was symmetrical.   Motor exam:   Normal tone, muscle bulk and symmetric strength in all extremities. Sensory:  Fine touch, pinprick and vibration were tested in all extremities. Proprioception tested in the upper extremities was normal. Coordination:  Finger-to-nose maneuver  normal without evidence of ataxia, dysmetria or tremor. Gait and station: Patient walks without assistive device and is able unassisted to climb up to the exam table. Strength within normal limits.Stance is stable and normal.  Tandem gait is unfragmented. Turns with 3 Steps. Romberg testing is negative. Deep tendon reflexes: in the  upper and lower extremities are symmetric and intact.    Assessment:  After physical and neurologic examination, review of laboratory studies,  Personal review of imaging  studies, reports of other /same  Imaging studies, results of polysomnography and / or neurophysiology testing and pre-existing records as far as provided in visit., my assessment is   1) I would not need for Mr. Angelle at this point to undergo a new sleep study.  His ASV works well the current settings are right for him his residual AHI is very low, he does not have other complaints except for his supplies needing to be refurbished.  His history clearly speaks of complex sleep apnea apparently central apnea dominant there would be no reason for him to give him a CPAP machine as central apneas will emerge on the Pap treatment.  Not quite sure what he is supposed to do with his S10 machine?.   2) I will order supplies through [facility] , FX swift nasal pillow, headgear,  his newer air curve s 10 is a ASV as well, and needs to  be set.   3) Obesity- his main risk factor- he has no physiological reasons for central apnea.    The patient was advised of the nature of the diagnosed disorder , the treatment options and the  risks for general health and wellness arising from not treating the condition.   I spent more than 45  minutes of face to face time with the patient.  Greater than 50% of time was spent in counseling and coordination of care. We have discussed the diagnosis and differential and I answered the patient's questions.    Plan:  Treatment plan and additional workup :  I will order the patient's specifically  Needed supplies for his older ASV machine, but I also will ask [facility] to set his new ASV capable machine to the same settings, this means CPAP of 4 cm water, minimum pressure support additional 4 cm water, maximum pressure support 14 cm water. He takes a tylenol PM every night.    Craig Seat, MD 24/58/0998, 3:38 PM  Certified in Neurology by ABPN Certified in [place] by [facility] [address] [place], [place]  [id number]

## 2017-07-15 NOTE — Patient Instructions (Signed)

## 2017-07-16 ENCOUNTER — Encounter: Payer: Self-pay | Admitting: Gastroenterology

## 2017-07-16 ENCOUNTER — Encounter: Payer: Self-pay | Admitting: Family Medicine

## 2017-07-17 ENCOUNTER — Other Ambulatory Visit: Payer: Self-pay | Admitting: Neurology

## 2017-07-17 DIAGNOSIS — G4733 Obstructive sleep apnea (adult) (pediatric): Secondary | ICD-10-CM

## 2017-07-20 ENCOUNTER — Encounter: Payer: Self-pay | Admitting: Family Medicine

## 2017-07-23 ENCOUNTER — Telehealth: Payer: Self-pay | Admitting: Neurology

## 2017-07-23 ENCOUNTER — Encounter: Payer: Self-pay | Admitting: Family Medicine

## 2017-07-23 ENCOUNTER — Ambulatory Visit (INDEPENDENT_AMBULATORY_CARE_PROVIDER_SITE_OTHER): Payer: Medicare Other | Admitting: Family Medicine

## 2017-07-23 VITALS — BP 132/72 | HR 65 | Temp 98.1°F | Ht 71.0 in | Wt 291.2 lb

## 2017-07-23 DIAGNOSIS — I10 Essential (primary) hypertension: Secondary | ICD-10-CM | POA: Diagnosis not present

## 2017-07-23 DIAGNOSIS — E785 Hyperlipidemia, unspecified: Secondary | ICD-10-CM

## 2017-07-23 DIAGNOSIS — M7062 Trochanteric bursitis, left hip: Secondary | ICD-10-CM

## 2017-07-23 DIAGNOSIS — R945 Abnormal results of liver function studies: Secondary | ICD-10-CM | POA: Diagnosis not present

## 2017-07-23 DIAGNOSIS — E039 Hypothyroidism, unspecified: Secondary | ICD-10-CM

## 2017-07-23 DIAGNOSIS — R972 Elevated prostate specific antigen [PSA]: Secondary | ICD-10-CM

## 2017-07-23 DIAGNOSIS — R7989 Other specified abnormal findings of blood chemistry: Secondary | ICD-10-CM

## 2017-07-23 NOTE — Telephone Encounter (Signed)
Pt called said he has not heard anything from [facility]. He has called and LVM at least 3 times and has talked with them twice and was told they did not have an order. He said he is wanting to go there today but he wanting to make sure the order has been sent. The pt said he is not impressed with DME company. Pt keeps saying he has central sleep apnea and needs this taken care of asap. Please call to advise asap.

## 2017-07-23 NOTE — Assessment & Plan Note (Signed)
S: poorly controlled on no rx- advised atorvastatin 20mg - he has not picked this up yet.  Lab Results  Component Value Date   CHOL 216 (H) 06/23/2017   HDL 42.80 06/23/2017   LDLDIRECT 126.0 06/23/2017   TRIG 270.0 (H) 06/23/2017   CHOLHDL 5 06/23/2017   A/P: advised to start atorvastatin 20mg - he has not picked it up but will strongly consider

## 2017-07-23 NOTE — Assessment & Plan Note (Signed)
Discussed normal tsh on synthroid 25 mcg and to continue current medicine Lab Results  Component Value Date   TSH 2.45 06/23/2017

## 2017-07-23 NOTE — Assessment & Plan Note (Signed)
S: we had discussed importance of weight loss but patient has not lost any weight Wt Readings from Last 3 Encounters:  07/23/17 291 lb 3.2 oz (132.1 kg)  07/15/17 290 lb (131.5 kg)  06/16/17 292 lb 9.6 oz (132.7 kg)   Lab Results  Component Value Date   ALT 46 06/23/2017   AST 50 (H) 06/23/2017   ALKPHOS 53 06/23/2017   BILITOT 0.9 06/23/2017  also discussed how weight could be related to LFT elevation. He does not drink any alcohol. Luckily at least cbg was not high.  A/P: Encouraged need for healthy eating, regular exercise, weight loss. At least 10 lbs weight loss advised by follow up. discusse drelation to other disease processes

## 2017-07-23 NOTE — Telephone Encounter (Signed)
I called the patient back and made him aware that the orders were not sent over to [facility] until dec 28 which was last Friday. I explained to him the reason that took so long was because we had to get records from his previous place. Once I got those I sent them the same day received which was 07/17/2017. I informed him that [facility] was closed on Monday and tues and that im sure they came into a lot of orders on patients on wed. I explained to him that I would reach out personally to [facility] and make them aware of his concern. I did ask the patient that he bare with them and give them a little more time to reply.

## 2017-07-23 NOTE — Progress Notes (Signed)
Subjective:  Craig Farrell is a 67 y.o. year old very pleasant male patient who presents for/with See problem oriented charting ROS- no fever or chills. Does have left hip and knee pain. No chest pain.    Past Medical History-  Patient Active Problem List   Diagnosis Date Noted  . Morbid obesity ([place]) 05/07/2017    Priority: High  . Hyperlipidemia 06/23/2017    Priority: Medium  . BPH associated with nocturia 06/16/2017    Priority: Medium  . Osteoarthritis of left knee 05/07/2017    Priority: Medium  . OSA on CPAP     Priority: Medium  . Arthritis     Priority: Medium  . Hypertension     Priority: Medium  . Hypothyroidism     Priority: Medium  . History of adenomatous polyp of colon     Priority: Medium  . Tinnitus 05/07/2017    Priority: Low  . Allergic rhinitis     Priority: Low  . Yellow jacket sting allergy     Priority: Low  . Erectile dysfunction     Priority: Low  . Hypersomnia with sleep apnea 07/15/2017  . 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 Encounter for counseling on adaptive servo-ventilation (ASV) use 07/15/2017  . Class 3 severe obesity due to excess calories without serious comorbidity in adult [facility]) 07/15/2017    Medications- reviewed and updated Current Outpatient Medications  Medication Sig Dispense Refill  . acetaminophen (TYLENOL) 500 MG tablet Take by mouth as needed.    . ASPIRIN 81 PO Take 81 mg by mouth daily.     . Azelastine HCl 0.15 % SOLN SPRAY 2 SPRAY BY INTRANASAL ROUTE EVERY DAY IN EACH NOSTRIL  0  . celecoxib (CELEBREX) 200 MG capsule Take 200 mg by mouth daily.     . Cholecalciferol (GNP VITAMIN D MAXIMUM STRENGTH) 2000 units TABS Take 2,000 Units by mouth 2 (two) times daily.    Marland Kitchen EPINEPHrine (EPIPEN 2-PAK) 0.3 mg/0.3 mL IJ SOAJ injection EpiPen 2-Pak 0.3 mg/0.3 mL injection, auto-injector 2 Device 1  . lisinopril (PRINIVIL,ZESTRIL) 10 MG tablet Take 10 mg by mouth daily.  1  . tadalafil (CIALIS) 20 MG tablet Take 1 tablet (20  mg total) by mouth as needed. 10 tablet 5  . atorvastatin (LIPITOR) 20 MG tablet Take 1 tablet (20 mg total) by mouth daily. (Patient not taking: Reported on 07/23/2017) 90 tablet 3  . SYNTHROID 25 MCG tablet Take 1 tablet (25 mcg total) by mouth daily. (Patient not taking: Reported on 07/23/2017) 90 tablet 3   No current facility-administered medications for this visit.     Objective: BP 132/72 (BP Location: Left Arm, Patient Position: Sitting, Cuff Size: Large)   Pulse 65   Temp 98.1 F (36.7 C) (Oral)   Ht 5\' 11"  (1.803 m)   Wt 291 lb 3.2 oz (132.1 kg)   SpO2 96%   BMI 40.61 kg/m  Gen: NAD, resting comfortably CV: RRR no murmurs rubs or gallops Lungs: CTAB no crackles, wheeze, rhonchi Abdomen: soft/nontender/nondistended/normal bowel sounds. Morbid obesity Ext: no edema Skin: warm, dry Msk: pain with palpation over greater trochanter. Normal movement of hip with minimal pain and no groin pain reported. Some knee pain with maneuvers  Assessment/Plan:  Elevated PSA - Plan: PSA S: prior records indicate decent PSA increase Lab Results  Component Value Date   PSA 2.49 06/23/2017   PSA 1.03 10/26/2014  A/P: we are going to get another PSA in 3 months to monitor  trend  Trochanteric bursitis of left hip - Plan: Ambulatory referral to Sports Medicine S: Left hip- severe pain described just a few days ago/week ago and worsened over several weeks. Very tender over greater trochanter on left with no groin pain.  Was very tender. Had been down to 200mg  every other day of celebrex. Now doing 200-400mg  daily (up to 200mg  BID). L knee was hurting and is better as well. With celebrex has had drastic improvement. Was having a catching feeling in hip which is rare now.   Fell at [facility] in 2014 and had severe pain- intermittent issues since then. Has had bursitis before and injection healed completely for a period.   A/P: much improved with increase in Celebrex dose but lingers- he wants to discuss  potential injection with Dr. Paulla Fore and this was set up for him. Was also apparently told would need knee replacement one day so wants to get Dr. Nicolasa Ducking opinion on who to use locally.   Hyperlipidemia S: poorly controlled on no rx- advised atorvastatin 20mg - he has not picked this up yet.  Lab Results  Component Value Date   CHOL 216 (H) 06/23/2017   HDL 42.80 06/23/2017   LDLDIRECT 126.0 06/23/2017   TRIG 270.0 (H) 06/23/2017   CHOLHDL 5 06/23/2017   A/P: advised to start atorvastatin 20mg - he has not picked it up but will strongly consider  Morbid obesity ([place]) S: we had discussed importance of weight loss but patient has not lost any weight Wt Readings from Last 3 Encounters:  07/23/17 291 lb 3.2 oz (132.1 kg)  07/15/17 290 lb (131.5 kg)  06/16/17 292 lb 9.6 oz (132.7 kg)   Lab Results  Component Value Date   ALT 46 06/23/2017   AST 50 (H) 06/23/2017   ALKPHOS 53 06/23/2017   BILITOT 0.9 06/23/2017  also discussed how weight could be related to LFT elevation. He does not drink any alcohol. Luckily at least cbg was not high.  A/P: Encouraged need for healthy eating, regular exercise, weight loss. At least 10 lbs weight loss advised by follow up. discusse drelation to other disease processes   Hypertension S: controlled on lisinopril 10mg  alone.  BP Readings from Last 3 Encounters:  07/23/17 132/72  07/15/17 (!) 158/79  06/16/17 (!) 142/72  A/P: We discussed blood pressure goal of <140/90. Continue current meds:  Luckily at goal today- will have to monitor closely each visit  Hypothyroidism Discussed normal tsh on synthroid 25 mcg and to continue current medicine Lab Results  Component Value Date   TSH 2.45 06/23/2017      Future Appointments  Date Time Provider [place]  07/24/2017 11:00 AM Gerda Diss, DO [facility]  08/31/2017  3:30 PM Ladene Artist, MD [facility]  10/14/2017  2:30 PM Dohmeier, Asencion Partridge, MD [facility] None  10/21/2017 10:00 AM  [facility] LAB [facility]  01/19/2018  3:30 PM Marin Olp, MD [facility]    Elevated PSA - Plan: PSA  Trochanteric bursitis of left hip - Plan: Ambulatory referral to Sports Medicine  Elevated LFTs - Plan: Comprehensive metabolic panel  Return precautions advised.  [name], MD

## 2017-07-23 NOTE — Patient Instructions (Addendum)
Schedule lab visit at check out for 3 months from now to recheck PSA. Will also check in on liver.   Blood pressure looks great  Schedule visit with Dr. Paulla Fore before you leave to follow up on hip pain and consider injection  Once hip and knee calm down- start the atorvastatin. May be able to come off later with significant weight loss  Also need weight loss given slightly bump in liver tests  Lets check in 6 months from now- goal at least 10 lbs down in that time frame

## 2017-07-23 NOTE — Assessment & Plan Note (Signed)
S: controlled on lisinopril 10mg  alone.  BP Readings from Last 3 Encounters:  07/23/17 132/72  07/15/17 (!) 158/79  06/16/17 (!) 142/72  A/P: We discussed blood pressure goal of <140/90. Continue current meds:  Luckily at goal today- will have to monitor closely each visit

## 2017-07-24 ENCOUNTER — Ambulatory Visit: Payer: PRIVATE HEALTH INSURANCE | Admitting: Sports Medicine

## 2017-07-28 ENCOUNTER — Ambulatory Visit (INDEPENDENT_AMBULATORY_CARE_PROVIDER_SITE_OTHER): Payer: Medicare Other

## 2017-07-28 ENCOUNTER — Encounter: Payer: Self-pay | Admitting: Sports Medicine

## 2017-07-28 ENCOUNTER — Ambulatory Visit: Payer: Self-pay

## 2017-07-28 ENCOUNTER — Ambulatory Visit (INDEPENDENT_AMBULATORY_CARE_PROVIDER_SITE_OTHER): Payer: Medicare Other | Admitting: Sports Medicine

## 2017-07-28 VITALS — BP 142/86 | HR 65 | Ht 71.0 in | Wt 287.0 lb

## 2017-07-28 DIAGNOSIS — M1990 Unspecified osteoarthritis, unspecified site: Secondary | ICD-10-CM

## 2017-07-28 DIAGNOSIS — Z6841 Body Mass Index (BMI) 40.0 and over, adult: Secondary | ICD-10-CM | POA: Diagnosis not present

## 2017-07-28 DIAGNOSIS — M1612 Unilateral primary osteoarthritis, left hip: Secondary | ICD-10-CM

## 2017-07-28 DIAGNOSIS — M1712 Unilateral primary osteoarthritis, left knee: Secondary | ICD-10-CM | POA: Diagnosis not present

## 2017-07-28 DIAGNOSIS — M179 Osteoarthritis of knee, unspecified: Secondary | ICD-10-CM | POA: Diagnosis not present

## 2017-07-28 DIAGNOSIS — M25562 Pain in left knee: Secondary | ICD-10-CM

## 2017-07-28 DIAGNOSIS — M25552 Pain in left hip: Secondary | ICD-10-CM

## 2017-07-28 DIAGNOSIS — E66813 Obesity, class 3: Secondary | ICD-10-CM

## 2017-07-28 NOTE — Procedures (Signed)
PROCEDURE NOTE -  ULTRASOUND GUIDEDInjection: Left hip Images were obtained and interpreted by myself, Teresa Coombs, DO  Images have been saved and stored to PACS system. Images obtained on: GE S7 Ultrasound machine  ULTRASOUND FINDINGS:  Large effusion with bulging of the anterior labrum.  Small amount of subcutaneous edema within the hip abductor's but no overt bursal fluid.  DESCRIPTION OF PROCEDURE:  The patient's clinical condition is marked by substantial pain and/or significant functional disability. Other conservative therapy has not provided relief, is contraindicated, or not appropriate. There is a reasonable likelihood that injection will significantly improve the patient's pain and/or functional impairment.  After discussing the risks, benefits and expected outcomes of the injection and all questions were reviewed and answered, the patient wished to undergo the above named procedure. Verbal consent was obtained.  The ultrasound was used to identify the target structure and adjacent neurovascular structures. The skin was then prepped in sterile fashion and the target structure was injected under direct visualization using sterile technique as below:  Left PREP: Alcohol, Ethel Chloride,  APPROACH: direct, stopcock technique, 22g 3.5in. INJECTATE: 5cc 1% lidocaine, 2cc 0.5% marcaine, 2cc 40mg /mL DepoMedrol  ASPIRATE: N/A DRESSING: Band-Aid    Post procedural instructions including recommending icing and warning signs for infection were reviewed.  This procedure was well tolerated and there were no complications.   IMPRESSION: Succesful US Guided Injection

## 2017-07-28 NOTE — Assessment & Plan Note (Signed)
Unfortunately this does limit his ability for total joint.  He is very close to being under a BMI of 40 he should continue to work on maintaining his activity level dietary changes.

## 2017-07-28 NOTE — Assessment & Plan Note (Signed)
Would like to see him try to cut back on the Celebrex as much as possible.  He was having some side effects to the Celebrex including leg pain and tinnitus has improved since decreasing his dose.  Ultimately he will likely need a total knee arthroplasty and possibly total hip at some point in the future but currently is having overall well-maintained range of motion of the knee.

## 2017-07-28 NOTE — Assessment & Plan Note (Signed)
Intra-articular injection performed today of the left hip given the effusion was appreciated.  He has moderate arthritis with a likely acute labral irritation.  If persistent mechanical symptoms could consider further diagnostic evaluation with MRI but this will likely not change management. Avoid deep squats and knee bends.  Continue to remain as active as possible.  Follow-up in 6 weeks for reevaluation.

## 2017-07-28 NOTE — Assessment & Plan Note (Signed)
End-stage degenerative changes on x-ray with valgus subluxation of the knee. Responded well previously to Visco supplementation and this can be considered versus repeat corticosteroid injection.  Continue with compression and we will see how he responds to injection today before proceeding with any further knee intervention.

## 2017-07-28 NOTE — Progress Notes (Signed)
Craig Farrell. Craig Farrell, [facility]  Craig Farrell - 67 y.o. male MRN [id number]  Date of birth: [birth year]  Visit Date: 07/28/2017  PCP: Marin Olp, MD   Referred by: Marin Olp, MD   Scribe for today's visit: Josepha Pigg, CMA     SUBJECTIVE:  Craig Farrell "[name]" is here for New Patient (Initial Visit) (LT hip pain) .  Referred by: Craig Reddish, MD His LT hip pain symptoms INITIALLY: Began after a fall at work 3 years ago. Pain has gotten worse over the past 1-2 weeks.   Described as moderate-severe sharp pain, nonradiating Worsened with movement, he reports that he can stand up but before he gets completely up his hip will catch. Pain is also worse when trying to lift heavy objects. Pain is also worse when trying to get in and out of a car.  Improved with rest. Additional associated symptoms include: Pt denies groin pain, low back pain, pain in gluteal region.  Pt also c/o LT knee pain. He denies swelling at the hip or around the knee. He does feel a "knot" on his left hip at times which is tender to palpation. He had surgery on the LT knee in 1980 for issues with his cartilage.     At this time symptoms are worsening compared to onset  He has been been taking Celebrex 200-400 mg daily. He is unable to take Advil or Aleve d/t side effects. He had injection in SI joint of RT hip in the past and he did tolerate this well. He has also has steroid and visco injections in the last.     ROS Reports night time disturbances. Denies fevers, chills, or night sweats. Denies unexplained weight loss. Denies personal history of cancer. Denies changes in bowel or bladder habits. Denies recent unreported falls. Reports new or worsening dyspnea or wheezing. Denies headaches or dizziness.  Reports numbness, tingling or weakness  In the extremities.  Denies dizziness or presyncopal episodes Denies lower extremity  edema     HISTORY & PERTINENT PRIOR DATA:  Prior History reviewed and updated per electronic medical record.  Significant history, findings, studies and interim changes include:  reports that  has never smoked. he has never used smokeless tobacco. No results for input(s): HGBA1C, LABURIC, CREATINE in the last 8760 hours. No specialty comments available. Problem  Primary Osteoarthritis of Left Hip  Class 3 Severe Obesity Due to Excess Calories Without Serious Comorbidity in Adult (Hcc)  Primary Osteoarthritis of Left Knee   Was told needed knee replacement- waiting until pain worse   Arthritis   Knees, Hips and shoulders. celebrex 254m BID. plans to cut down with medicare costs. Multiple hydrocodone rx in past- would avoid. RA negative in past. ESR slightly high 28     OBJECTIVE:  VS:  HT:'5\' 11"'  (180.3 cm)   WT:287 lb (130.2 kg)  BMI:40.05    BP:(!) 142/86  HR:65bpm  TEMP: ( )  RESP:96 %   PHYSICAL EXAM: Constitutional: WDWN, Non-toxic appearing. Psychiatric: Alert & appropriately interactive. Not depressed or anxious appearing. Respiratory: No increased work of breathing. Trachea Midline Eyes: Pupils are equal. EOM intact without nystagmus. No scleral icterus Cardiovascular:  Peripheral Pulses: peripheral pulses symmetrical No clubbing or cyanosis appreciated Capillary Refill is normal, less than 2 seconds No signficant generalized edema/anasarca Sensory Exam: intact to light touch  Left hip: Well aligned.  He does walk with an antalgic gait with  dynamic genu valgus and slight Trendelenburg.  He has some weakness with hip abduction but this is mild.  Moderate TTP over the greater trochanter more so over the gluteal tendons as opposed to the actual bursa.  He has pain with failure and Stinchfield testing which is mild.  Lower extremity strength however is 5 out of 5 in all myotomes.  Negative straight leg raise bilaterally. Left knee: Slight genu valgus.  Ligamentously he  has 3-4 mm of opening with varus and valgus stressing but solid endpoints.  Mild pain with McMurray's.  No significant effusion today.  Extensor mechanism intact. No additional findings.   ASSESSMENT & PLAN:   1. Left hip pain   2. Left knee pain, unspecified chronicity   3. Arthritis   4. Primary osteoarthritis of left knee   5. Primary osteoarthritis of left hip   6. Class 3 severe obesity due to excess calories without serious comorbidity with body mass index (BMI) of 40.0 to 44.9 in adult [facility])    PLAN:    Primary osteoarthritis of left knee End-stage degenerative changes on x-ray with valgus subluxation of the knee. Responded well previously to Visco supplementation and this can be considered versus repeat corticosteroid injection.  Continue with compression and we will see how he responds to injection today before proceeding with any further knee intervention.  Primary osteoarthritis of left hip Intra-articular injection performed today of the left hip given the effusion was appreciated.  He has moderate arthritis with a likely acute labral irritation.  If persistent mechanical symptoms could consider further diagnostic evaluation with MRI but this will likely not change management. Avoid deep squats and knee bends.  Continue to remain as active as possible.  Follow-up in 6 weeks for reevaluation.  Arthritis Would like to see him try to cut back on the Celebrex as much as possible.  He was having some side effects to the Celebrex including leg pain and tinnitus has improved since decreasing his dose.  Ultimately he will likely need a total knee arthroplasty and possibly total hip at some point in the future but currently is having overall well-maintained range of motion of the knee.  Class 3 severe obesity due to excess calories without serious comorbidity in adult [facility]) Unfortunately this does limit his ability for total joint.  He is very close to being under a BMI of 40 he should  continue to work on maintaining his activity level dietary changes.   ++++++++++++++++++++++++++++++++++++++++++++ Orders & Meds: Orders Placed This Encounter  Procedures  . DG HIP UNILAT W OR W/O PELVIS 2-3 VIEWS LEFT  . DG Knee AP/LAT W/Sunrise Left  . US GUIDED NEEDLE PLACEMENT(NO LINKED CHARGES)    No orders of the defined types were placed in this encounter.   ++++++++++++++++++++++++++++++++++++++++++++ Follow-up: Return in about 6 weeks (around 09/08/2017).   Pertinent documentation may be included in additional procedure notes, imaging studies, problem based documentation and patient instructions. Please see these sections of the encounter for additional information regarding this visit. CMA/ATC served as Education administrator during this visit. History, Physical, and Plan performed by medical provider. Documentation and orders reviewed and attested to.      Gerda Diss, [facility] Physician

## 2017-07-28 NOTE — Patient Instructions (Signed)

## 2017-07-30 ENCOUNTER — Institutional Professional Consult (permissible substitution): Payer: Self-pay | Admitting: Neurology

## 2017-08-12 ENCOUNTER — Encounter: Payer: Self-pay | Admitting: Sports Medicine

## 2017-08-12 ENCOUNTER — Ambulatory Visit (INDEPENDENT_AMBULATORY_CARE_PROVIDER_SITE_OTHER): Payer: Medicare Other | Admitting: Sports Medicine

## 2017-08-12 ENCOUNTER — Telehealth: Payer: Self-pay | Admitting: Family Medicine

## 2017-08-12 ENCOUNTER — Ambulatory Visit: Payer: Self-pay

## 2017-08-12 VITALS — BP 150/84 | HR 60 | Ht 71.0 in | Wt 291.0 lb

## 2017-08-12 DIAGNOSIS — M1712 Unilateral primary osteoarthritis, left knee: Secondary | ICD-10-CM | POA: Diagnosis not present

## 2017-08-12 NOTE — Patient Instructions (Signed)

## 2017-08-12 NOTE — Procedures (Signed)
PROCEDURE NOTE:  Ultrasound Guided: Aspiration and Injection: Left knee Images were obtained and interpreted by myself, Teresa Coombs, DO  Images have been saved and stored to PACS system. Images obtained on: GE S7 Ultrasound machine  ULTRASOUND FINDINGS:  Large effusion  DESCRIPTION OF PROCEDURE:  The patient's clinical condition is marked by substantial pain and/or significant functional disability. Other conservative therapy has not provided relief, is contraindicated, or not appropriate. There is a reasonable likelihood that injection will significantly improve the patient's pain and/or functional impairment.  After discussing the risks, benefits and expected outcomes of the injection and all questions were reviewed and answered, the patient wished to undergo the above named procedure. Verbal consent was obtained.  The ultrasound was used to identify the target structure and adjacent neurovascular structures. The skin was then prepped in sterile fashion and the target structure was injected under direct visualization using sterile technique as below:  PREP: Alcohol, Ethel Chloride, 5cc 1% lidocaine on 25g 1.5 in. Needle APPROACH: direct, stopcock technique, 18g 1.5in. INJECTATE: 2cc 0.5% marcaine, 2cc 40mg /mL DepoMedrol   ASPIRATE: 30 cc of straw-colored yellow fluid   DRESSING: Band-Aid and: Body Helix Full Knee Compression Sleeve   Post procedural instructions including recommending icing and warning signs for infection were reviewed.  This procedure was well tolerated and there were no complications.   IMPRESSION: Succesful Ultrasound Guided: Aspiration and Injection

## 2017-08-12 NOTE — Telephone Encounter (Signed)
The patient would like to know if he needs to have a lab appointment to recheck his TSH before his already schedule lab appointment in April. This question is due to him stopping his Synthoid in December.

## 2017-08-12 NOTE — Progress Notes (Signed)
Craig Farrell. Farrell, [facility] at [place]  Craig Farrell - 67 y.o. male MRN [id number]  Date of birth: [birth year]  Visit Date: 08/12/2017  PCP: Craig Olp, MD   Referred by: Craig Olp, MD    SUBJECTIVE:  Craig Farrell "[name]" is here for Follow-up (LT knee pain)  Compared to the last office visit, his previously described symptoms are worsening, knee continues to have locking/catching. His knee has been very painful and stiff, he is having a hard time walking on it. He drove to [facility] yesterday and the trip was hard on his knees. Current symptoms are moderate-severe & are nonradiating He has been taking Celebrex, 200-400 mg daily with some relief. He has been using compression off and on but sometimes the knee is too swollen and its painful.   Pt reports improvement in LT hip pain.    ROS Reports night time disturbances. Denies fevers, chills, or night sweats. Denies unexplained weight loss. Denies personal history of cancer. Denies changes in bowel or bladder habits. Denies recent unreported falls. Denies new or worsening dyspnea or wheezing. Denies headaches or dizziness.  Reports numbness, tingling or weakness  In the extremities.  Denies dizziness or presyncopal episodes Denies lower extremity edema     HISTORY & PERTINENT PRIOR DATA:  Prior History reviewed and updated per electronic medical record.  Significant history, findings, studies and interim changes include:  reports that  has never smoked. he has never used smokeless tobacco. No results for input(s): HGBA1C, LABURIC, CREATINE in the last 8760 hours. 08/18/2017 - orthovisc is covered by Medicare 80/20 after deductible has been met, no copay, OOP does not apply. Craig Farrell is part F supplement and insurance responsibility is 100%. No problems updated.  OBJECTIVE:  VS:  HT:_0  (180.3 cm)   WT:291 lb (132 kg)  BMI:40.6    BP:(!) 150/84  HR:60bpm   TEMP: ( )  RESP:97 %   PHYSICAL EXAM: Constitutional: WDWN, Non-toxic appearing. Psychiatric: Alert & appropriately interactive.  Not depressed or anxious appearing. Respiratory: No increased work of breathing.  Trachea Midline Eyes: Pupils are equal.  EOM intact without nystagmus.  No scleral icterus  NEUROVASCULAR exam: No clubbing or cyanosis appreciated No significant venous stasis changes Capillary Refill: normal, less than 2 seconds   Left hip and knee are well aligned with generalized osteoarthritic bossing that is mild.  He has pain with varus and valgus stressing that is mild pain with palpation of the medial lateral joint lines.  Small effusion.     ASSESSMENT & PLAN:   1. Primary osteoarthritis of left knee    PLAN: Intra-articular aspiration injection performed today per procedure note.  We will plan to see how he responds to this and can consider Visco supplement injection versus long acting triamcinolone if any lack of improvement  No problem-specific Assessment & Plan notes found for this encounter.   ++++++++++++++++++++++++++++++++++++++++++++ Orders & Meds: Orders Placed This Encounter  Procedures  . [place] LIMITED JOINT SPACE STRUCTURES LOW LEFT(NO LINKED CHARGES)    No orders of the defined types were placed in this encounter.   ++++++++++++++++++++++++++++++++++++++++++++ Follow-up: Return in about 6 weeks (around 09/23/2017).   Pertinent documentation may be included in additional procedure notes, imaging studies, problem based documentation and patient instructions. Please see these sections of the encounter for additional information regarding this visit. CMA/ATC served as Education administrator during this visit. History, Physical, and Plan performed by medical provider. Documentation and  orders reviewed and attested to.      Gerda Diss, [facility] Physician

## 2017-08-13 NOTE — Telephone Encounter (Signed)
Yes from prior mychart message he needed 6 week recheck from time he stopped medicine- April would be too long

## 2017-08-14 ENCOUNTER — Other Ambulatory Visit: Payer: Self-pay

## 2017-08-14 DIAGNOSIS — E039 Hypothyroidism, unspecified: Secondary | ICD-10-CM

## 2017-08-14 NOTE — Telephone Encounter (Signed)
Called patient. I entered in the order for a repeat TSH. I scheduled patient for a lab appointment.

## 2017-08-25 ENCOUNTER — Encounter: Payer: Self-pay | Admitting: Family Medicine

## 2017-08-25 ENCOUNTER — Other Ambulatory Visit (INDEPENDENT_AMBULATORY_CARE_PROVIDER_SITE_OTHER): Payer: Medicare Other

## 2017-08-25 DIAGNOSIS — E039 Hypothyroidism, unspecified: Secondary | ICD-10-CM | POA: Diagnosis not present

## 2017-08-25 LAB — TSH: TSH: 2.63 u[IU]/mL (ref 0.35–4.50)

## 2017-08-30 ENCOUNTER — Other Ambulatory Visit: Payer: Self-pay | Admitting: Family Medicine

## 2017-08-31 ENCOUNTER — Ambulatory Visit (INDEPENDENT_AMBULATORY_CARE_PROVIDER_SITE_OTHER): Payer: Medicare Other | Admitting: Gastroenterology

## 2017-08-31 ENCOUNTER — Encounter: Payer: Self-pay | Admitting: Gastroenterology

## 2017-08-31 VITALS — BP 124/72 | HR 74 | Ht 71.0 in | Wt 289.2 lb

## 2017-08-31 DIAGNOSIS — Z86010 Personal history of colonic polyps: Secondary | ICD-10-CM | POA: Diagnosis not present

## 2017-08-31 DIAGNOSIS — Z800 Family history of malignant neoplasm of digestive organs: Secondary | ICD-10-CM

## 2017-08-31 DIAGNOSIS — K921 Melena: Secondary | ICD-10-CM | POA: Diagnosis not present

## 2017-08-31 MED ORDER — NA SULFATE-K SULFATE-MG SULF 17.5-3.13-1.6 GM/177ML PO SOLN: 1.0000 | Freq: Once | ORAL | 0 refills | Status: AC

## 2017-08-31 NOTE — Progress Notes (Signed)
History of Present Illness: This is a 67 year male referred by Marin Olp, MD for the evaluation of personal history of adenomatous colon polyps, family history of colon cancer, constipation and hematochezia.  He is accompanied by his wife.  Last colonoscopy was performed in March 2014 and showed 2 adenomatous colon polyps.  His mother developed colon cancer in her mid 55s. he has intermittent difficulties with mild constipation and recently has noted a couple episodes with small amounts of bright red blood after a constipated stool.  No other gastrointestinal complaints. Denies weight loss, abdominal pain, diarrhea, change in stool caliber, melena, nausea, vomiting, dysphagia, reflux symptoms, chest pain.     Allergies  Allergen Reactions  . Yellow Jacket Venom [Bee Venom] Anaphylaxis  . Augmentin [Amoxicillin-Pot Clavulanate] Other (See Comments)    Bloody stool  . Ibuprofen Other (See Comments)    skaking  . Levaquin [Levofloxacin] Diarrhea  . Oxycodone Nausea And Vomiting  . Prednisone Other (See Comments)    Altered mental state  . Sudafed [Pseudoephedrine] Rash  . Vioxx [Rofecoxib] Rash   Outpatient Medications Prior to Visit  Medication Sig Dispense Refill  . acetaminophen (TYLENOL) 500 MG tablet Take by mouth as needed.    . ASPIRIN 81 PO Take 81 mg by mouth daily.     Marland Kitchen atorvastatin (LIPITOR) 20 MG tablet Take 1 tablet (20 mg total) by mouth daily. 90 tablet 3  . Azelastine HCl 0.15 % SOLN SPRAY 2 SPRAY BY INTRANASAL ROUTE EVERY DAY IN EACH NOSTRIL  0  . celecoxib (CELEBREX) 200 MG capsule TAKE 1 CAPSULE BY MOUTH 2 TIMES EVERY DAY AS NEEDED 180 capsule 1  . Cholecalciferol (GNP VITAMIN D MAXIMUM STRENGTH) 2000 units TABS Take 2,000 Units by mouth 2 (two) times daily.    Marland Kitchen EPINEPHrine (EPIPEN 2-PAK) 0.3 mg/0.3 mL IJ SOAJ injection EpiPen 2-Pak 0.3 mg/0.3 mL injection, auto-injector 2 Device 1  . lisinopril (PRINIVIL,ZESTRIL) 10 MG tablet TAKE 1 TABLET BY MOUTH EVERY  DAY 90 tablet 1  . tadalafil (CIALIS) 20 MG tablet Take 1 tablet (20 mg total) by mouth as needed. 10 tablet 5  . SYNTHROID 25 MCG tablet Take 1 tablet (25 mcg total) by mouth daily. 90 tablet 3   No facility-administered medications prior to visit.    Past Medical History:  Diagnosis Date  . Allergic rhinitis    astelin  . Arthritis    Hips and shoulders. celebrex 200mg  BID. plans to cut down with medicare costs  . Blood in stool    augmentin  . Erectile dysfunction    cialis 20mg   . Hx of diverticulitis of colon   . Hypertension    lisinopril 10mg   . Hypothyroidism    synthroid 25 mcg  . OSA on CPAP   . Sleep apnea    ASV machine  . Tubular adenoma of colon    2014- was told q3 years- needs update. likely adenoma  . UTI (urinary tract infection)    x1- in hospital.   . Yellow jacket sting allergy    epi pens   Past Surgical History:  Procedure Laterality Date  . HAND SURGERY Right   . left foot surgery     fracture related  . left knee surgery     1980   Social History   Socioeconomic History  . Marital status: Married    Spouse name: None  . Number of children: 1  . Years of education: 50  . Highest  education level: None  Social Needs  . Financial resource strain: None  . Food insecurity - worry: None  . Food insecurity - inability: None  . Transportation needs - medical: None  . Transportation needs - non-medical: None  Occupational History  . None  Tobacco Use  . Smoking status: Never Smoker  . Smokeless tobacco: Never Used  Substance and Sexual Activity  . Alcohol use: No  . Drug use: No  . Sexual activity: None  Other Topics Concern  . None  Social History Narrative   Married 1973. 1 child 33 in [place]- will be in [place] still. 73 month old grandbaby 04/2017.       3 years of college. 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       Two cups caffeine  daily.      Right-handed.   Family History  Problem Relation Age of Onset  . Arthritis Mother   . Colon cancer Mother        late 32s  . Arthritis Sister   . Arthritis Sister   . Heart failure Father   . Parkinson's disease Father   . Dementia Father   . Colon cancer Maternal Aunt   . Healthy Child       Review of Systems: Pertinent positive and negative review of systems were noted in the above HPI section. All other review of systems were otherwise negative.    Physical Exam: General: Well developed, well nourished, no acute distress Head: Normocephalic and atraumatic Eyes:  sclerae anicteric, EOMI Ears: Normal auditory acuity Mouth: No deformity or lesions Neck: Supple, no masses or thyromegaly Lungs: Clear throughout to auscultation Heart: Regular rate and rhythm; no murmurs, rubs or bruits Abdomen: Soft, non tender and non distended. No masses, hepatosplenomegaly or hernias noted. Normal Bowel sounds Rectal: Deferred to colonoscopy Musculoskeletal: Symmetrical with no gross deformities  Skin: No lesions on visible extremities Pulses:  Normal pulses noted Extremities: No clubbing, cyanosis, edema or deformities noted Neurological: Alert oriented x 4, grossly nonfocal Cervical Nodes:  No significant cervical adenopathy Inguinal Nodes: No significant inguinal adenopathy Psychological:  Alert and cooperative. Normal mood and affect  Assessment and Recommendations:  1.  Personal history of adenomatous colon polyps.  Mother with colon cancer in her 8s.  He is due for 5-year interval surveillance colonoscopy due to his history of polyps.  Schedule colonoscopy. The risks (including bleeding, perforation, infection, missed lesions, medication reactions and possible hospitalization or surgery if complications occur), benefits, and alternatives to colonoscopy with possible biopsy and possible polypectomy were discussed with the patient and they consent to proceed.   2.   Intermittent mild constipation and recent small volume hematochezia.  I suspect a benign anorectal source such as hemorrhoids.  Advised to increase dietary water and fiber intake.  Further evaluation at colonoscopy although the primary reason for colonoscopy as his personal history of adenomatous colon polyps.   cc: Marin Olp, [place] [address] [place], [place] [id number]

## 2017-08-31 NOTE — Patient Instructions (Signed)
You have been scheduled for a colonoscopy. Please follow written instructions given to you at your visit today.  Please pick up your prep supplies at the pharmacy within the next 1-3 days. If you use inhalers (even only as needed), please bring them with you on the day of your procedure. Your physician has requested that you go to www.startemmi.com and enter the access code given to you at your visit today. This web site gives a general overview about your procedure. However, you should still follow specific instructions given to you by our office regarding your preparation for the procedure.  Normal BMI (Body Mass Index- based on height and weight) is between 23 and 30. Your BMI today is Body mass index is 40.34 kg/m. Marland Kitchen Please consider follow up  regarding your BMI with your Primary Care Provider.  Thank you for choosing me and [facility].  Pricilla Riffle. Dagoberto Ligas., MD., [facility]

## 2017-09-04 ENCOUNTER — Encounter: Payer: Self-pay | Admitting: Sports Medicine

## 2017-09-08 ENCOUNTER — Ambulatory Visit (INDEPENDENT_AMBULATORY_CARE_PROVIDER_SITE_OTHER): Payer: Medicare Other | Admitting: Sports Medicine

## 2017-09-08 ENCOUNTER — Telehealth: Payer: Self-pay

## 2017-09-08 ENCOUNTER — Encounter: Payer: Self-pay | Admitting: Sports Medicine

## 2017-09-08 DIAGNOSIS — M1990 Unspecified osteoarthritis, unspecified site: Secondary | ICD-10-CM | POA: Diagnosis not present

## 2017-09-08 DIAGNOSIS — E785 Hyperlipidemia, unspecified: Secondary | ICD-10-CM

## 2017-09-08 DIAGNOSIS — M1712 Unilateral primary osteoarthritis, left knee: Secondary | ICD-10-CM | POA: Diagnosis not present

## 2017-09-08 DIAGNOSIS — M25562 Pain in left knee: Secondary | ICD-10-CM | POA: Diagnosis not present

## 2017-09-08 NOTE — Telephone Encounter (Signed)
Would be happy to sit down to discuss his concerns at a visit. I think a statin does the best job of lowering his cardiac risk. I am balancing that with potential side effects- we can talk them through if he wants.

## 2017-09-08 NOTE — Assessment & Plan Note (Signed)
Discussed Statin vs Fibrate therapy today in great detail. Recommend starting Statin - will have him discuss this further with Dr. Yong Channel

## 2017-09-08 NOTE — Assessment & Plan Note (Signed)
Good response to last steroid with great improvement for 2 weeks. Given response he should respond to Zilretta well and we will get him approved and f/u later this week

## 2017-09-08 NOTE — Telephone Encounter (Signed)
Pt in to see Dr. Paulla Fore today and expressed concerns about taking a statin for his cholesterol. He has read about the side effects and notes that a couple of people in his family, including his wife, have had bad reactions when using statins, myalgia/joint pain. He has not started taking the statin (atorvastatin) yet. His wife takes fenofibrate 145 mg with no side effects.

## 2017-09-08 NOTE — Progress Notes (Signed)
[name], [place] at [place] [facility]  Taydon Nasworthy - 67 y.o. male MRN [id number]  Date of birth: 19-May-1951  Visit Date: 09/08/2017  PCP: Marin Olp, MD   Referred by: Marin Olp, MD   Scribe for today's visit: Josepha Pigg, CMA     SUBJECTIVE:  Tasia Catchings "[name]" is here for Follow-up (osteoarthritis L knee)  07/28/17 His LT hip pain symptoms INITIALLY: Began after a fall at work 3 years ago. Pain has gotten worse over the past 1-2 weeks.   Described as moderate-severe sharp pain, nonradiating Worsened with movement, he reports that he can stand up but before he gets completely up his hip will catch. Pain is also worse when trying to lift heavy objects. Pain is also worse when trying to get in and out of a car.  Improved with rest. Additional associated symptoms include: Pt denies groin pain, low back pain, pain in gluteal region.  Pt also c/o LT knee pain. He denies swelling at the hip or around the knee. He does feel a "knot" on his left hip at times which is tender to palpation. He had surgery on the LT knee in 1980 for issues with his cartilage.     09/08/17: Compared to the last office visit, his previously described symptoms are worsening, Sunday he could hardly bend his knee to get in the car. He has noticed increased stiffness and swelling. Normally the pain is medial but recently the pain has been more lateral. Stiffness is worse when first getting up in the morning and seems to improve throughout the day. He has felt like at times his knee has been catching and may give out on him.  Current symptoms are moderate & are radiating to the thigh.  He has tried wearing the Body Helix compression sleeve but it wouldn't stay up on his leg so he went back to his old compression sleeve. He only wears it when he feels like his knee is weak. He does feel like he got some relief after last steroid injection but it only  lasted a couple of weeks and has been getting progressively worse over time.     ROS Reports night time disturbances. Denies fevers, chills, or night sweats. Denies unexplained weight loss. Denies personal history of cancer. Denies changes in bowel or bladder habits. Denies recent unreported falls. Denies new or worsening dyspnea or wheezing. Denies headaches or dizziness.  Reports numbness, tingling or weakness  In the extremities.  Denies dizziness or presyncopal episodes Reports lower extremity edema     HISTORY & PERTINENT PRIOR DATA:  Prior History reviewed and updated per electronic medical record.  Significant history, findings, studies and interim changes include:  reports that  has never smoked. he has never used smokeless tobacco. No results for input(s): HGBA1C, LABURIC, CREATINE in the last 8760 hours.  Problem  Hyperlipidemia   Advise statin   Primary Osteoarthritis of Left Knee   Was told needed knee replacement- waiting until pain worse     OBJECTIVE:  VS:  HT:    WT:   BMI:     BP:   HR: bpm  TEMP: ( )  RESP:    PHYSICAL EXAM: Constitutional: WDWN, Non-toxic appearing. Psychiatric: Alert & appropriately interactive.  Not depressed or anxious appearing. Respiratory: No increased work of breathing.  Trachea Midline Eyes: Pupils are equal.  EOM intact without nystagmus.  No scleral icterus  NEUROVASCULAR exam:  No clubbing or cyanosis appreciated No significant venous stasis changes Capillary Refill: normal, less than 2 seconds    Left Knee  Generalized osteoarthritic bossing. Skin: No overlying erythema/ecchymosis Effusion: Small Generalized Synovitis: mild Knee Tenderness: Generalized DTP diffusely along medial and lateral joint lines.  Pain with patellar grind. Gait: antalgic   RANGE OF MOTION & STRENGTH  EXTENSION: Normal  with no pain Strength: Normal FLEXION: Normal with no pain Strength: Normal   LIGAMENTOUS TESTING  Varus & Valgus  Strain: stable to testing Anterior & Posterior Drawer: stable to testing: Lachman's: stable to testing   SPECIALITY TESTING:  Crepitation with Patellar Grind: Yes Patellar Apprehension: No Mcmurray's: Painful      ASSESSMENT & PLAN:   1. Primary osteoarthritis of left knee   2. Left knee pain, unspecified chronicity   3. Arthritis   4. Hyperlipidemia, unspecified hyperlipidemia type    ++++++++++++++++++++++++++++++++++++++++++++ Orders & Meds: No orders of the defined types were placed in this encounter.  No orders of the defined types were placed in this encounter.   ++++++++++++++++++++++++++++++++++++++++++++ PLAN:  Findings:  Long discussion today regarding options for injection.  He is previously undergone Visco supplementation with moderate improvement but given the persistent small effusion that he has had and ongoing generalized synovitis he may benefit from long-acting Zilretta and we will set him up for this later this week.     Additionally extensive time was spent discussing the merits of statin therapy which has been recommended by his PCP.>50% of this 25 minute visit spent in direct patient counseling and/or coordination of care.  Discussion was focused on education regarding the in discussing the pathoetiology and anticipated clinical course of the above condition.    Follow-up: Return for Zilretta injection.   Pertinent documentation may be included in additional procedure notes, imaging studies, problem based documentation and patient instructions. Please see these sections of the encounter for additional information regarding this visit. CMA/ATC served as Education administrator during this visit. History, Physical, and Plan performed by medical provider. Documentation and orders reviewed and attested to.      Gerda Diss, [facility] Physician

## 2017-09-09 ENCOUNTER — Encounter: Payer: Self-pay | Admitting: Sports Medicine

## 2017-09-10 ENCOUNTER — Encounter: Payer: Self-pay | Admitting: Family Medicine

## 2017-09-10 ENCOUNTER — Ambulatory Visit (INDEPENDENT_AMBULATORY_CARE_PROVIDER_SITE_OTHER): Payer: Medicare Other | Admitting: Family Medicine

## 2017-09-10 VITALS — BP 144/68 | HR 59 | Temp 98.0°F | Ht 71.0 in | Wt 286.8 lb

## 2017-09-10 DIAGNOSIS — E785 Hyperlipidemia, unspecified: Secondary | ICD-10-CM

## 2017-09-10 DIAGNOSIS — I10 Essential (primary) hypertension: Secondary | ICD-10-CM | POA: Diagnosis not present

## 2017-09-10 NOTE — Telephone Encounter (Signed)
Patient is scheduled for this afternoon at 4:00pm

## 2017-09-10 NOTE — Progress Notes (Signed)
Subjective:  Craig Farrell Farrell is a 67 y.o. year old very pleasant male patient who presents for/with See problem oriented charting ROS- No chest pain or shortness of breath. No headache or blurry vision. Sleeping better with CPAP   Past Medical History-  Patient Active Problem List   Diagnosis Date Noted  . Morbid obesity ([place]) 05/07/2017    Priority: High  . Hyperlipidemia 06/23/2017    Priority: Medium  . BPH associated with nocturia 06/16/2017    Priority: Medium  . Primary osteoarthritis of left knee 05/07/2017    Priority: Medium  . OSA on CPAP     Priority: Medium  . Arthritis     Priority: Medium  . Hypertension     Priority: Medium  . Hypothyroidism     Priority: Medium  . History of adenomatous polyp of colon     Priority: Medium  . Tinnitus 05/07/2017    Priority: Low  . Allergic rhinitis     Priority: Low  . Yellow jacket sting allergy     Priority: Low  . Erectile dysfunction     Priority: Low  . Primary osteoarthritis of left hip 07/28/2017  . Hypersomnia with sleep apnea 07/15/2017  . 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 Encounter for counseling on adaptive servo-ventilation (ASV) use 07/15/2017  . Class 3 severe obesity due to excess calories without serious comorbidity in adult [facility]) 07/15/2017    Medications- reviewed and updated Current Outpatient Medications  Medication Sig Dispense Refill  . acetaminophen (TYLENOL) 500 MG tablet Take by mouth as needed.    . ASPIRIN 81 PO Take 81 mg by mouth daily.     . Azelastine HCl 0.15 % SOLN SPRAY 2 SPRAY BY INTRANASAL ROUTE EVERY DAY IN EACH NOSTRIL  0  . celecoxib (CELEBREX) 200 MG capsule TAKE 1 CAPSULE BY MOUTH 2 TIMES EVERY DAY AS NEEDED (Patient taking differently: TAKE 1 CAPSULE BY MOUTH  EVERY DAY AS NEEDED) 180 capsule 1  . Cholecalciferol (GNP VITAMIN D MAXIMUM STRENGTH) 2000 units TABS Take 2,000 Units by mouth 2 (two) times daily.    Marland Kitchen EPINEPHrine (EPIPEN 2-PAK) 0.3 mg/0.3 mL IJ SOAJ injection  EpiPen 2-Pak 0.3 mg/0.3 mL injection, auto-injector 2 Device 1  . lisinopril (PRINIVIL,ZESTRIL) 10 MG tablet TAKE 1 TABLET BY MOUTH EVERY DAY 90 tablet 1  . tadalafil (CIALIS) 20 MG tablet Take 1 tablet (20 mg total) by mouth as needed. 10 tablet 5  . atorvastatin (LIPITOR) 20 MG tablet Take 1 tablet (20 mg total) by mouth daily. (Patient not taking: Reported on 09/10/2017) 90 tablet 3   Objective: BP (!) 144/68 (BP Location: Left Arm, Patient Position: Sitting, Cuff Size: Large)   Pulse (!) 59   Temp 98 F (36.7 C) (Oral)   Ht 5\' 11"  (1.803 m)   Wt 286 lb 12.8 oz (130.1 kg)   SpO2 95%   BMI 40.00 kg/m  Gen: NAD, resting comfortably  Assessment/Plan:  Hypertension S: controlled poorly on lisinopril 10mg  BP Readings from Last 3 Encounters:  09/10/17 (!) 144/68  08/31/17 124/72  08/12/17 (!) 150/84  A/P: We discussed blood pressure goal of <140/90. Continue current meds for now. BP was controlled just 10 days ago. Discussed given 3 month trial of diet/exercise- if does not improve may need to increase rx at follow up  Hyperlipidemia S: poorly controlled on no rx. No myalgias.   He states he used to have significant leg pain at night and improved when cut down on celebrex-  he is trying not to take this at all.   He would prefer not to take statin or fenofibrate but is very fearful of statin risks.   He has already lost 5 lbs in last month- cutting down on breads/sugars  Recalculated ascvd 10 year risk using BP 10 days ago and risk 17% but could lower about 4-5% with statin.  Lab Results  Component Value Date   CHOL 216 (H) 06/23/2017   HDL 42.80 06/23/2017   LDLDIRECT 126.0 06/23/2017   TRIG 270.0 (H) 06/23/2017   CHOLHDL 5 06/23/2017   A/P: we had an extended discussion today and ultimately decided to work on 10 lbs weight loss over next 3 months- remove atorvastatin from list that was recommended. We can check LDL and triglycerides at follow up. If making progress- continue  weight loss efforts with goal to get to 245-250 within a year (or by time of next lipids) to try to avoid statin and treat more the unhealthy lifestyle than just the numbers themselves.    Future Appointments  Date Time Provider [place]  09/11/2017 11:00 AM Gerda Diss, DO [facility]  09/18/2017  4:00 PM Ladene Artist, MD [facility]  10/14/2017  2:30 PM Dohmeier, Asencion Partridge, MD [facility] None  10/21/2017 10:30 AM [facility]  10/21/2017 11:00 AM Gerda Diss, DO [facility]  01/19/2018  3:30 PM Yong Channel Brayton Mars, MD [facility]   Time Stamp The duration of face-to-face time during this visit was greater than 25 minutes (4:54 to 5;19 PM). Greater than 50% of this time was spent in counseling, explanation of diagnosis, planning of further management, and/or coordination of care including discussing how to lower triglycerides and going over handout, lipid goals, mortality and morbidity risk reduction with statin not present with fenofibrate, diet/exercise plan.    Return precautions advised.  [name], MD

## 2017-09-10 NOTE — Patient Instructions (Signed)
Get a morning visit in 3 months. Goal weight loss at least 10 lbs. Goal exercise 150 minutes a week. Goal veggies at least 3 servings a day. Goal water only.   Hold off atorvastatin for now. Recheck triglycerides and LDL at next visit.   Recheck blood pressure- suspect will also improve with weight loss  Goal in a year of 245-250 on our scales

## 2017-09-10 NOTE — Assessment & Plan Note (Signed)
S: controlled poorly on lisinopril 10mg  BP Readings from Last 3 Encounters:  09/10/17 (!) 144/68  08/31/17 124/72  08/12/17 (!) 150/84  A/P: We discussed blood pressure goal of <140/90. Continue current meds for now. BP was controlled just 10 days ago. Discussed given 3 month trial of diet/exercise- if does not improve may need to increase rx at follow up

## 2017-09-10 NOTE — Assessment & Plan Note (Addendum)
S: poorly controlled on no rx. No myalgias.   He states he used to have significant leg pain at night and improved when cut down on celebrex- he is trying not to take this at all.   He would prefer not to take statin or fenofibrate but is very fearful of statin risks.   He has already lost 5 lbs in last month- cutting down on breads/sugars  Recalculated ascvd 10 year risk using BP 10 days ago and risk 17% but could lower about 4-5% with statin.  Lab Results  Component Value Date   CHOL 216 (H) 06/23/2017   HDL 42.80 06/23/2017   LDLDIRECT 126.0 06/23/2017   TRIG 270.0 (H) 06/23/2017   CHOLHDL 5 06/23/2017   A/P: we had an extended discussion today and ultimately decided to work on 10 lbs weight loss over next 3 months- remove atorvastatin from list that was recommended. We can check LDL and triglycerides at follow up. If making progress- continue weight loss efforts with goal to get to 245-250 within a year (or by time of next lipids) to try to avoid statin and treat more the unhealthy lifestyle than just the numbers themselves.

## 2017-09-11 ENCOUNTER — Ambulatory Visit: Payer: Self-pay

## 2017-09-11 ENCOUNTER — Ambulatory Visit (INDEPENDENT_AMBULATORY_CARE_PROVIDER_SITE_OTHER): Payer: Medicare Other | Admitting: Sports Medicine

## 2017-09-11 ENCOUNTER — Encounter: Payer: Self-pay | Admitting: Sports Medicine

## 2017-09-11 VITALS — BP 138/62 | HR 63 | Wt 268.6 lb

## 2017-09-11 DIAGNOSIS — M1712 Unilateral primary osteoarthritis, left knee: Secondary | ICD-10-CM

## 2017-09-11 NOTE — Progress Notes (Signed)
Craig Farrell at [place]  Craig Farrell - 67 y.o. male MRN [id number]  Date of birth: 07/17/1951  Visit Date: 09/11/2017  PCP: Marin Olp, MD   Referred by: Marin Olp, MD   Scribe for today's visit: Lonell Grandchild, CMA     SUBJECTIVE:  Craig Farrell "Craig Farrell" is here for Follow-up (left knee pain ) .   Compared to the last office visit on 09/08/17, his previously described L knee pain symptoms are worsening he is now having pain from left knee to outside of calf.  Current symptoms are moderate & are radiating to outside of calf He has been taking tylenol and celebrex with little relief.    ROS Reports night time disturbances. Denies fevers, chills, or night sweats. Denies unexplained weight loss. Denies personal history of cancer. Denies changes in bowel or bladder habits. Denies recent unreported falls. Denies new or worsening dyspnea or wheezing. Denies headaches or dizziness.  Reports numbness, tingling or weakness  In the extremities.  Denies dizziness or presyncopal episodes Reports lower extremity edema     HISTORY & PERTINENT PRIOR DATA:  Prior History reviewed and updated per electronic medical record.  Significant history, findings, studies and interim changes include:  reports that  has never smoked. he has never used smokeless tobacco. No results for input(s): HGBA1C, LABURIC, CREATINE in the last 8760 hours. 08/18/2017 - orthovisc is covered by Medicare 80/20 after deductible has been met, no copay, OOP does not apply. Craig Farrell is part F supplement and insurance responsibility is 100%.  09/10/17 - Zilretta: Medicare will cover 80%, deductible has been met. [employer] will cover medicare copay and coinsurance. - [facility] No problems updated.  OBJECTIVE:  VS:  HT:    WT:268 lb 9.6 oz (121.8 kg)  BMI:37.48    BP:138/62  HR:63bpm  TEMP: ( )  RESP:98 %   PHYSICAL EXAM: Constitutional:  WDWN, Non-toxic appearing. Psychiatric: Alert & appropriately interactive.  Not depressed or anxious appearing. Respiratory: No increased work of breathing.  Trachea Midline Eyes: Pupils are equal.  EOM intact without nystagmus.  No scleral icterus  NEUROVASCULAR exam: No clubbing or cyanosis appreciated No significant venous stasis changes Capillary Refill: normal, less than 2 seconds   Left knee with generalized bossing.  Small effusion.  Full flexion and extension.  No significant lower extremity edema.  No overlying skin changes   ASSESSMENT & PLAN:   1. Primary osteoarthritis of left knee    ++++++++++++++++++++++++++++++++++++++++++++ Orders & Meds:  Orders Placed This Encounter  Procedures  . [place] MSK POCT ULTRASOUND  . Synovial cell count + diff, w/ crystals   No orders of the defined types were placed in this encounter.   ++++++++++++++++++++++++++++++++++++++++++++ PLAN:   Findings:  Zilretta injection today.  We will see how he does over the next 12 weeks and can consider Visco supplementation or repeat Zilretta if doing well.   No problem-specific Assessment & Plan notes found for this encounter.   Follow-up: Return in about 12 weeks (around 12/04/2017).   Pertinent documentation may be included in additional procedure notes, imaging studies, problem based documentation and patient instructions. Please see these sections of the encounter for additional information regarding this visit. CMA/ATC served as Education administrator during this visit. History, Physical, and Plan performed by medical provider. Documentation and orders reviewed and attested to.      Gerda Diss, [facility] Physician

## 2017-09-11 NOTE — Patient Instructions (Signed)

## 2017-09-12 LAB — SYNOVIAL CELL COUNT + DIFF, W/ CRYSTALS
Basophils, %: 0 %
Eosinophils-Synovial: 0 % (ref 0–2)
LYMPHOCYTES-SYNOVIAL FLD: 35 % (ref 0–74)
MONOCYTE/MACROPHAGE: 35 % (ref 0–69)
Neutrophil, Synovial: 24 % (ref 0–24)
SYNOVIOCYTES, %: 6 % (ref 0–15)
WBC, SYNOVIAL: 121 {cells}/uL (ref <150)

## 2017-09-13 ENCOUNTER — Encounter: Payer: Self-pay | Admitting: Sports Medicine

## 2017-09-14 ENCOUNTER — Encounter: Payer: Self-pay | Admitting: Sports Medicine

## 2017-09-14 NOTE — Progress Notes (Signed)
Normal synovial fluid.  No evidence of crystal arthropathy.  My chart message sent as below: The test of your joint fluid was normal and does not show any signs of gout or pseudogout.

## 2017-09-14 NOTE — Procedures (Signed)
PROCEDURE NOTE:  Ultrasound Guided: Aspiration and Injection: Left knee -Zilretta injection Images were obtained and interpreted by myself, Teresa Coombs, DO  Images have been saved and stored to PACS system. Images obtained on: GE S7 Ultrasound machine  ULTRASOUND FINDINGS:  Generalized synovitis with small effusion  DESCRIPTION OF PROCEDURE:  The patient's clinical condition is marked by substantial pain and/or significant functional disability. Other conservative therapy has not provided relief, is contraindicated, or not appropriate. There is a reasonable likelihood that injection will significantly improve the patient's pain and/or functional impairment.  After discussing the risks, benefits and expected outcomes of the injection and all questions were reviewed and answered, the patient wished to undergo the above named procedure. Verbal consent was obtained.  The ultrasound was used to identify the target structure and adjacent neurovascular structures. The skin was then prepped in sterile fashion and the target structure was injected under direct visualization using sterile technique as below:   PREP: Alcohol, Ethel Chloride, 5cc 1% lidocaine on 25g 1.5 in. Needle APPROACH: superiolateral, stopcock technique, 18g 1.5in. INJECTATE: 5cc: Of 32 mg per 5 mL's Zilretta (long-acting triamcinolone)   ASPIRATE: 22 cc of slightly orange colored synovial fluid   DRESSING: Band-Aid &: 6-inch Ace Wrap  Post procedural instructions including recommending icing and warning signs for infection were reviewed.   This procedure was well tolerated and there were no complications.     IMPRESSION: Succesful Ultrasound Guided: Aspiration and Injection

## 2017-09-18 ENCOUNTER — Encounter (HOSPITAL_COMMUNITY): Payer: Self-pay

## 2017-09-18 ENCOUNTER — Encounter: Payer: Medicare Other | Admitting: Gastroenterology

## 2017-09-18 ENCOUNTER — Emergency Department (HOSPITAL_COMMUNITY)
Admission: EM | Admit: 2017-09-18 | Discharge: 2017-09-18 | Disposition: A | Discharge status: Home / Self Care | Payer: Medicare Other | Attending: Emergency Medicine | Admitting: Emergency Medicine

## 2017-09-18 ENCOUNTER — Ambulatory Visit: Payer: Self-pay

## 2017-09-18 ENCOUNTER — Emergency Department (HOSPITAL_COMMUNITY): Payer: Medicare Other

## 2017-09-18 ENCOUNTER — Other Ambulatory Visit: Payer: Self-pay

## 2017-09-18 DIAGNOSIS — E039 Hypothyroidism, unspecified: Secondary | ICD-10-CM | POA: Diagnosis not present

## 2017-09-18 DIAGNOSIS — I10 Essential (primary) hypertension: Secondary | ICD-10-CM | POA: Insufficient documentation

## 2017-09-18 DIAGNOSIS — K760 Fatty (change of) liver, not elsewhere classified: Secondary | ICD-10-CM | POA: Diagnosis not present

## 2017-09-18 DIAGNOSIS — R002 Palpitations: Secondary | ICD-10-CM | POA: Insufficient documentation

## 2017-09-18 DIAGNOSIS — R1011 Right upper quadrant pain: Secondary | ICD-10-CM | POA: Diagnosis not present

## 2017-09-18 DIAGNOSIS — Z7982 Long term (current) use of aspirin: Secondary | ICD-10-CM | POA: Diagnosis not present

## 2017-09-18 DIAGNOSIS — Z79899 Other long term (current) drug therapy: Secondary | ICD-10-CM | POA: Diagnosis not present

## 2017-09-18 LAB — CBC WITH DIFFERENTIAL/PLATELET
Basophils Absolute: 0.1 10*3/uL (ref 0.0–0.1)
Basophils Relative: 1 %
EOS ABS: 0.1 10*3/uL (ref 0.0–0.7)
EOS PCT: 1 %
HCT: 44.7 % (ref 39.0–52.0)
HEMOGLOBIN: 15.8 g/dL (ref 13.0–17.0)
LYMPHS ABS: 2.7 10*3/uL (ref 0.7–4.0)
Lymphocytes Relative: 28 %
MCH: 34.3 pg — AB (ref 26.0–34.0)
MCHC: 35.3 g/dL (ref 30.0–36.0)
MCV: 97 fL (ref 78.0–100.0)
MONOS PCT: 8 %
Monocytes Absolute: 0.7 10*3/uL (ref 0.1–1.0)
Neutro Abs: 5.9 10*3/uL (ref 1.7–7.7)
Neutrophils Relative %: 62 %
PLATELETS: 240 10*3/uL (ref 150–400)
RBC: 4.61 MIL/uL (ref 4.22–5.81)
RDW: 13.2 % (ref 11.5–15.5)
WBC: 9.4 10*3/uL (ref 4.0–10.5)

## 2017-09-18 LAB — BASIC METABOLIC PANEL
Anion gap: 12 (ref 5–15)
BUN: 12 mg/dL (ref 6–20)
CHLORIDE: 103 mmol/L (ref 101–111)
CO2: 22 mmol/L (ref 22–32)
Calcium: 9.6 mg/dL (ref 8.9–10.3)
Creatinine, Ser: 0.83 mg/dL (ref 0.61–1.24)
GFR calc Af Amer: >60 mL/min (ref >60)
GFR calc non Af Amer: >60 mL/min (ref >60)
GLUCOSE: 83 mg/dL (ref 65–99)
POTASSIUM: 4.2 mmol/L (ref 3.5–5.1)
Sodium: 137 mmol/L (ref 135–145)

## 2017-09-18 LAB — I-STAT TROPONIN, ED: Troponin i, poc: 0 ng/mL (ref 0.00–0.08)

## 2017-09-18 MED ORDER — IOPAMIDOL (ISOVUE-370) INJECTION 76%
INTRAVENOUS | Status: AC
  Administered 2017-09-18: 100 mL
  Filled 2017-09-18: qty 100

## 2017-09-18 NOTE — ED Provider Notes (Signed)
[facility] EMERGENCY DEPARTMENT Provider Note   CSN: [id number] Arrival date & time: 09/18/17  1216     History   Chief Complaint Chief Complaint  Patient presents with  . Abdominal Pain    HPI Craig Farrell is a 67 y.o. male.  Patient is a 67 year old male with a history of hypertension, diverticulitis, hyperthyroidism and morbid obesity who is presenting today with pulsating sensation in his right upper quadrant that has been intermittent for the last few days.  He usually gets no more than a few seconds of symptoms but he can visibly see pulsations in his right upper quadrant.  He states during these events he has been doing numerous different things.  Sometimes just at rest sometimes with movement but they do not seem to be associated with any particular activity or eating.  He denies any shortness of breath, nausea, vomiting, diaphoresis or syncope with these events.  They have been occurring more frequently today than they did the last few days.  He denies any unilateral leg pain or swelling.  He does note that he recently last week got a steroid injection in his knee which is the only new medication.   The history is provided by the patient.  Abdominal Pain      Past Medical History:  Diagnosis Date  . Allergic rhinitis    astelin  . Arthritis    Hips and shoulders. celebrex 200mg  BID. plans to cut down with medicare costs  . Blood in stool    augmentin  . Erectile dysfunction    cialis 20mg   . Hx of diverticulitis of colon   . Hypertension    lisinopril 10mg   . Hypothyroidism    synthroid 25 mcg  . OSA on CPAP   . Sleep apnea    ASV machine  . Tubular adenoma of colon    2014- was told q3 years- needs update. likely adenoma  . UTI (urinary tract infection)    x1- in hospital.   . Yellow jacket sting allergy    epi pens    Patient Active Problem List   Diagnosis Date Noted  . Primary osteoarthritis of left hip 07/28/2017  . Hypersomnia  with sleep apnea 07/15/2017  . 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 Encounter for counseling on adaptive servo-ventilation (ASV) use 07/15/2017  . Class 3 severe obesity due to excess calories without serious comorbidity in adult ([place]) 07/15/2017  . Hyperlipidemia 06/23/2017  . BPH associated with nocturia 06/16/2017  . Primary osteoarthritis of left knee 05/07/2017  . Morbid obesity ([place]) 05/07/2017  . Tinnitus 05/07/2017  . Allergic rhinitis   . OSA on CPAP   . Arthritis   . Yellow jacket sting allergy   . Hypertension   . Hypothyroidism   . Erectile dysfunction   . History of adenomatous polyp of colon     Past Surgical History:  Procedure Laterality Date  . HAND SURGERY Right   . left foot surgery     fracture related  . left knee surgery     1980       Home Medications    Prior to Admission medications   Medication Sig Start Date End Date Taking? Authorizing Provider  acetaminophen (TYLENOL) 500 MG tablet Take by mouth as needed.    [provider]  ASPIRIN 81 PO Take 81 mg by mouth daily.     [provider]  Azelastine HCl 0.15 % SOLN SPRAY 2 SPRAY BY INTRANASAL ROUTE  EVERY DAY IN [facility] NOSTRIL 03/03/17   [provider]  celecoxib (CELEBREX) 200 MG capsule TAKE 1 CAPSULE BY MOUTH 2 TIMES EVERY DAY AS NEEDED Patient taking differently: TAKE 1 CAPSULE BY MOUTH  EVERY DAY AS NEEDED 08/31/17   Marin Olp, MD  Cholecalciferol (GNP VITAMIN D MAXIMUM STRENGTH) 2000 units TABS Take 2,000 Units by mouth 2 (two) times daily.    [provider]  EPINEPHrine (EPIPEN 2-PAK) 0.3 mg/0.3 mL IJ SOAJ injection EpiPen 2-Pak 0.3 mg/0.3 mL injection, auto-injector 06/16/17   Marin Olp, MD  lisinopril (PRINIVIL,ZESTRIL) 10 MG tablet TAKE 1 TABLET BY MOUTH EVERY DAY 08/31/17   Marin Olp, MD  tadalafil (CIALIS) 20 MG tablet Take 1 tablet (20 mg total) by mouth as needed. 06/16/17   Marin Olp, MD    Family  History Family History  Problem Relation Age of Onset  . Arthritis Mother   . Colon cancer Mother        late 38s  . Arthritis Sister   . Arthritis Sister   . Heart failure Father   . Parkinson's disease Father   . Dementia Father   . Colon cancer Maternal Aunt   . Healthy Child     Social History Social History   Tobacco Use  . Smoking status: Never Smoker  . Smokeless tobacco: Never Used  Substance Use Topics  . Alcohol use: No  . Drug use: No     Allergies   Yellow jacket venom [bee venom]; Augmentin [amoxicillin-pot clavulanate]; Ibuprofen; Levaquin [levofloxacin]; Oxycodone; Prednisone; Sudafed [pseudoephedrine]; and Vioxx [rofecoxib]   Review of Systems Review of Systems  Gastrointestinal: Positive for abdominal pain.  All other systems reviewed and are negative.    Physical Exam Updated Vital Signs BP (!) 141/68 (BP Location: Right Arm)   Pulse (!) 54   Temp 98.2 F (36.8 C) (Oral)   Resp 16   Ht 5\' 11"  (1.803 m)   Wt 131.5 kg (290 lb)   SpO2 98%   BMI 40.45 kg/m   Physical Exam  Constitutional: He is oriented to person, place, and time. He appears well-developed and well-nourished. No distress.  HENT:  Head: Normocephalic and atraumatic.  Mouth/Throat: Oropharynx is clear and moist.  Eyes: Conjunctivae and EOM are normal. Pupils are equal, round, and reactive to light.  Neck: Normal range of motion. Neck supple.  Cardiovascular: Normal rate, regular rhythm and intact distal pulses.  No murmur heard. Pulmonary/Chest: Effort normal and breath sounds normal. No respiratory distress. He has no wheezes. He has no rales.  Abdominal: Soft. He exhibits no distension. There is no tenderness. There is no rebound and no guarding.  Musculoskeletal: Normal range of motion. He exhibits no edema or tenderness.  Neurological: He is alert and oriented to person, place, and time.  Skin: Skin is warm and dry. No rash noted. No erythema.  Psychiatric: He has a  normal mood and affect. His behavior is normal.  Nursing note and vitals reviewed.    ED Treatments / Results  Labs (all labs ordered are listed, but only abnormal results are displayed) Labs Reviewed  CBC WITH DIFFERENTIAL/PLATELET - Abnormal; Notable for the following components:      Result Value   MCH 34.3 (*)    All other components within normal limits  BASIC METABOLIC PANEL  I-STAT TROPONIN, ED    EKG  EKG Interpretation  Date/Time:  Friday September 18 2017 18:38:34 EST Ventricular Rate:  54 PR Interval:  QRS Duration: 157 QT Interval:  447 QTC Calculation: 424 R Axis:   -5 Text Interpretation:  Sinus rhythm Right bundle branch block Baseline wander in lead(s) V2 No previous tracing Confirmed by Blanchie Dessert [phone]) on 09/18/2017 6:44:26 PM       Radiology Ct Angio Abd/pel W/ And/or W/o  Result Date: 09/18/2017 CLINICAL DATA:  Right upper abdominal pulsatile sensation x3 days EXAM: CTA ABDOMEN AND PELVIS WITH CONTRAST TECHNIQUE: Multidetector CT imaging of the abdomen and pelvis was performed using the standard protocol during bolus administration of intravenous contrast. Multiplanar reconstructed images and MIPs were obtained and reviewed to evaluate the vascular anatomy. CONTRAST:  11mL ISOVUE-370 IOPAMIDOL (ISOVUE-370) INJECTION 76% COMPARISON:  None. FINDINGS: VASCULAR Aorta: Minimal atheromatous calcified plaque. No aneurysm, dissection, or stenosis. Celiac: Patent without evidence of aneurysm, dissection, vasculitis or significant stenosis. SMA: Patent without evidence of aneurysm, dissection, vasculitis or significant stenosis. Renals: Both renal arteries are patent without evidence of aneurysm, dissection, vasculitis, fibromuscular dysplasia or significant stenosis. IMA: Patent without evidence of aneurysm, dissection, vasculitis or significant stenosis. Inflow: Patent without evidence of aneurysm, dissection, vasculitis or significant stenosis. Proximal Outflow:  Bilateral common femoral and visualized portions of the superficial and profunda femoral arteries are patent without evidence of aneurysm, dissection, vasculitis or significant stenosis. Veins: No obvious venous abnormality within the limitations of this arterial phase study. Bilateral pelvic phleboliths. Review of the MIP images confirms the above findings. NON-VASCULAR Lower chest: No acute abnormality. Hepatobiliary: Fatty liver without discrete lesion. Gallbladder is nondilated. No biliary ductal dilatation. Pancreas: Unremarkable. No pancreatic ductal dilatation or surrounding inflammatory changes. Spleen: Normal in size without focal abnormality. Adrenals/Urinary Tract: Adrenal glands are unremarkable. Kidneys are normal, without renal calculi, focal lesion, or hydronephrosis. Bladder is unremarkable. The Stomach/Bowel: Stomach is nondistended. Small bowel decompressed. Normal appendix. Colon is nondilated, unremarkable. Lymphatic: No abdominal or pelvic adenopathy. Reproductive: Mild prostatic enlargement Other: No ascites.  No free air. Musculoskeletal: Anterior vertebral endplate spurring at multiple levels in the lower thoracic and upper lumbar spine. Negative for fracture or worrisome bone lesion. IMPRESSION: VASCULAR 1. Minimal aortic plaque. No aneurysm, dissection, or other acute findings. NON-VASCULAR 1. Fatty liver. 2. Prostatic enlargement. Electronically Signed   By: Lucrezia Europe M.D.   On: 09/18/2017 15:23    Procedures Procedures (including critical care time)  Medications Ordered in ED Medications  iopamidol (ISOVUE-370) 76 % injection (100 mLs  Contrast Given 09/18/17 1503)     Initial Impression / Assessment and Plan / ED Course  I have reviewed the triage vital signs and the nursing notes.  Pertinent labs & imaging results that were available during my care of the patient were reviewed by me and considered in my medical decision making (see chart for details).     Patient  presenting today with a complaint of pulsations in his right upper quadrant that are occurring intermittently over the last few days.  No associated symptoms and they are not triggered by anything.  Patient is well-appearing on exam.  He is noted to have occasional PVCs on the monitor but no other evidence of dysrhythmia.  He denies any chest pain or shortness of breath.  Patient has no abdominal pain on exam.  He does note recently getting a steroid injection in his knee last week but no other medication changes.  Patient was seen in triage and initially had a CTA of his abdomen and pelvis ordered due to his complaint however there are no findings suggestive of aneurysm, dissection  or artery narrowing.  Other than a fatty liver he had no other acute findings.  Patient's EKG today shows a right bundle branch block without old to compare.  CBC, BMP are within normal limits.  Troponin is wnl.   Will d/c home.  Concerned that patient symptoms may be related to the recent injection he got in his knee and this is an adverse side effect.  Did recommend he follow-up with cardiologist for ongoing monitoring if symptoms worsen.  Final Clinical Impressions(s) / ED Diagnoses   Final diagnoses:  Palpitations    ED Discharge Orders    None       Blanchie Dessert, MD 09/18/17 2050

## 2017-09-18 NOTE — Telephone Encounter (Signed)
See note

## 2017-09-18 NOTE — ED Notes (Signed)
Pt reports abd pains that make his abd twitch.

## 2017-09-18 NOTE — Telephone Encounter (Signed)
Phone call from pt. with report of a "pounding pulse in my mid, and slightly right upper abdomen."  Stated "it feels just like a heart beat."  Reported he has 4-5 intermittent episodes since Tuesday; reported it really gets his attention.  Stated he has slight tenderness at same site, and more pronounced when the pounding sensation starts. Questioned about or back pain.  Reported pain in upper back, beneath shoulder blade about 7:30 AM today, but has subsided now.  Denied any additional abdominal pain.   Stated he has felt more tired recently, but attributed it to recent medication changes.  C/o a "dull headache with pressure behind my eyes."  Denied nausea/ vomiting.  Denied fever/ chills.  Denied checking his BP today.   Called FC at [facility] @ [place].  Advised of pt's. Symptoms.  Discussed with Dr. Jerline Pain.  Advised to send to ER.    Pt. And wife advised to go to the ER.  Verb. understanding. Agreed with plan.  Called ER @ [facility] and informed Triage nurse of pt's symptoms.        Reason for Disposition . [1] MODERATE pain (e.g., interferes with normal activities) AND [2] pain comes and goes (cramps) AND [3] present > 24 hours  (Exception: pain with Vomiting or Diarrhea - see that Guideline)  Answer Assessment - Initial Assessment Questions 1. DESCRIPTION: "Please describe your heart rate or heart beat that you are having" (e.g., fast/slow, regular/irregular, skipped or extra beats, "palpitations")     Feeling a pounding in mid upper right abdomen 2. ONSET: "When did it start?" (Minutes, hours or days)      It was 1st noticied on Tues; once Tues, once Wed, twice Thurs, and twice today.   3 . DURATION: "How long does it last" (e.g., seconds, minutes, hours)    Lasting about 30-40 seconds 4. PATTERN "Does it come and go, or has it been constant since it started?"  "Does it get worse with exertion?"   "Are you feeling it now?"    Comes and goes 5. TAP: "Using your hand, can you tap out what  you are feeling on a chair or table in front of you, so that I can hear?" (Note: not all patients can do this)       n/a 6. HEART RATE: "Can you tell me your heart rate?" "How many beats in 15 seconds?"  (Note: not all patients can do this)      Wife checked radial pulse rate; 72 bpm 7. RECURRENT SYMPTOM: "Have you ever had this before?" If so, ask: "When was the last time?" and "What happened that time?"      New onset 8. CAUSE: "What do you think is causing the palpitations?"     rec'd injection of Zilretta on 2/22 on left knee 9. CARDIAC HISTORY: "Do you have any history of heart disease?" (e.g., heart attack, angina, bypass surgery, angioplasty, arrhythmia)      Denied any heart problems 10. OTHER SYMPTOMS: "Do you have any other symptoms?" (e.g., dizziness, chest pain, sweating, difficulty breathing)       Slightly tender abdomen; increases with bounding pulse 11. PREGNANCY: "Is there any chance you are pregnant?" "When was your last menstrual period?"      n/a  Answer Assessment - Initial Assessment Questions 1. LOCATION: "Where does it hurt?"      Mid to right upper abdomen; feels a pounding in abdomen; like a heart beat 2. RADIATION: "Does the pain shoot  anywhere else?" (e.g., chest, back)     No 3. ONSET: "When did the pain begin?" (Minutes, hours or days ago)     Tuesday 4. SUDDEN: "Gradual or sudden onset?"     Sudden  5. PATTERN "Does the pain come and go, or is it constant?"    - If constant: "Is it getting better, staying the same, or worsening?"      (Note: Constant means the pain never goes away completely; most serious pain is constant and it progresses)     - If intermittent: "How long does it last?" "Do you have pain now?"     (Note: Intermittent means the pain goes away completely between bouts)     Comes and goes; may feel mildly faint at time of pounding pulsation in mid- right upper abdomen 6. SEVERITY: "How bad is the pain?"  (e.g., Scale 1-10; mild, moderate, or  severe)    - MILD (1-3): doesn't interfere with normal activities, abdomen soft and not tender to touch     - MODERATE (4-7): interferes with normal activities or awakens from sleep, tender to touch     - SEVERE (8-10): excruciating pain, doubled over, unable to do any normal activities       "moderate" 7. RECURRENT SYMPTOM: "Have you ever had this type of abdominal pain before?" If so, ask: "When was the last time?" and "What happened that time?"      No  8. CAUSE: "What do you think is causing the abdominal pain?"     Unknown 9. RELIEVING/AGGRAVATING FACTORS: "What makes it better or worse?" (e.g., movement, antacids, bowel movement)    No  10. OTHER SYMPTOMS: "Has there been any vomiting, diarrhea, constipation, or urine problems?"      Denied nausea/vomiting; reported (L) upper back pain beneath shoulder blade this morning; now subsided.  Protocols used: ABDOMINAL PAIN - MALE-A-AH, HEART RATE AND HEARTBEAT QUESTIONS-A-AH

## 2017-09-18 NOTE — ED Triage Notes (Signed)
Per Pt, Pt is coming from home with complaints of pulsating in his right upper abdomen that started about three days ago. Denies any change in bowel movements. Denies CP or SOB. No change in warmth, cap refill, pulses, or swelling to feet.

## 2017-09-19 NOTE — Telephone Encounter (Signed)
Noted thanks. Looks like they took good care of him in ED with CT scan which was reassuring and sent home. PVCs may have been cause

## 2017-09-21 ENCOUNTER — Ambulatory Visit: Payer: Self-pay

## 2017-09-21 ENCOUNTER — Telehealth: Payer: Self-pay | Admitting: Sports Medicine

## 2017-09-21 ENCOUNTER — Other Ambulatory Visit: Payer: Self-pay

## 2017-09-21 DIAGNOSIS — R002 Palpitations: Secondary | ICD-10-CM

## 2017-09-21 NOTE — Telephone Encounter (Signed)
Personally called and spoke with patient regarding the episode of abdominal pulsations and palpitations over the weekend.

## 2017-09-21 NOTE — Telephone Encounter (Signed)
Called and spoke with Craig Farrell regarding the episode over the weekend of palpitations.  He has recently undergone a injection with long-acting, sustained release triamcinolone (Kenalog).  The likelihood of this being a contributing factor to his episode is low however this can obviously not be completely ruled out and we will report this to the manufacturer given this is new to market.  The mechanism of Zilretta causing or even being related to these symptoms is not something that I could explain and once again think that this is likely not related.  Corticosteroids in general could obviously cause symptoms and cardiac related problems however he has recently undergone an other Intra-articular injection in January, using 80 mg of Depo-Medrol.  The symptoms are concerning however and he should be further evaluated by cardiology at this point given the likelihood of this being tachypalpitations but are most likely PVCs.  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   Zilretta pharmacokinetic data actually reveals a significantly lower amount of systemic release when compared to the Shorter-acting corticosteroid formulations.  The mechanism of the medication is that it implants into the synovium of the joint and has a slow release of triamcinolone (a total of 32 mg) over a 12-week time frame.    I have asked him to call and update [place] with any changes and see that he has already been referred to Cardiology.

## 2017-09-21 NOTE — Telephone Encounter (Signed)
See note and help advise.

## 2017-09-21 NOTE — Telephone Encounter (Signed)
Phone call from pt.  Reported that he is still feeling intermittent pulsations in upper abdomen, just below rib cage to the right and left of the middle upper abdomen.  Stated the pulsations are less pronounced than they were on 10-26-22.  Described of this being more of a "quiver."  Reported the episodes have increased in frequency yesterday and today.  Stated he felt the episodes happened at least 25 times this morning.  Denied feeling any palpitations in his chest.  Wife checked pt's radial pulse for one minute; 61 bpm/ regular.  Denied chest pain, dizziness, or shortness of breath. C/o dull headache.  Described an intermittent feeling of overall warmth, but doesn't feel it is always associated with pulsations in upper abdomen. Denied any abdominal pain.  Reported "intermittent back pain just above the belt line, across the back."  Stated "if I need to give this more time, in case it is related to the medication I rec'd for knee injection, then I will."  (rec'd Zilretta injection in left knee on 09/11/17, per Dr. Paulla Fore)  Pt. voiced concern that this may need to be further evaluated by a Cardiologist.  Advised pt. will send Triage note to office, for further recommendation. Pt. agrees with plan.  Encouraged to call back if symptoms worsen.             Reason for Disposition . [1] Palpitations AND [2] no improvement after following Care Advice  Answer Assessment - Initial Assessment Questions 1. DESCRIPTION: "Please describe your heart rate or heart beat that you are having" (e.g., fast/slow, regular/irregular, skipped or extra beats, "palpitations")     Feeling heart beat in his upper right and left abdomen, just below the rib cage ; less pounding "more like a quiver" 2. ONSET: "When did it start?" (Minutes, hours or days)      Oct 26, 2022; 09/18/17  3. DURATION: "How long does it last" (e.g., seconds, minutes, hours)     Increased in frequency ; lasting 3-5 minutes; has had approx. 25 episodes this morning. 4.  PATTERN "Does it come and go, or has it been constant since it started?"  "Does it get worse with exertion?"   "Are you feeling it now?"     It comes and goes 5. TAP: "Using your hand, can you tap out what you are feeling on a chair or table in front of you, so that I can hear?" (Note: not all patients can do this)       N/a  6. HEART RATE: "Can you tell me your heart rate?" "How many beats in 15 seconds?"  (Note: not all patients can do this)       61 bpm, regular, per radial pulse, checked by pt's spouse 7. RECURRENT SYMPTOM: "Have you ever had this before?" If so, ask: "When was the last time?" and "What happened that time?"     Onset on 10-26-22, 3/1 8. CAUSE: "What do you think is causing the palpitations?"     Poss. Side effect of medication he received with knee injection on 2/22 9. CARDIAC HISTORY: "Do you have any history of heart disease?" (e.g., heart attack, angina, bypass surgery, angioplasty, arrhythmia)      Was made aware of slight irregularity in rhythm in the past 10. OTHER SYMPTOMS: "Do you have any other symptoms?" (e.g., dizziness, chest pain, sweating, difficulty breathing)       Denies dizziness, chest discomfort, shortness of breath; stated some warmth with episodes of feeling the pulsations in the  upper abdomen; c/o dull headache  11. PREGNANCY: "Is there any chance you are pregnant?" "When was your last menstrual period?"       n/a  Protocols used: HEART RATE AND HEARTBEAT QUESTIONS-A-AH

## 2017-09-21 NOTE — Telephone Encounter (Signed)
Called and spoke with patient. I placed a referral to Cardiology for him as the ED had recommended Cardiology. He states he hasn't seen a Cardiologist since moving here but that he used to see one. We reviewed over symptoms and he feels this is being caused by his heart. Patient is aware of referral.  Note per ED reads: Patient presenting today with a complaint of pulsations in his right upper quadrant that are occurring intermittently over the last few days.  No associated symptoms and they are not triggered by anything.  Patient is well-appearing on exam.  He is noted to have occasional PVCs on the monitor but no other evidence of dysrhythmia.  He denies any chest pain or shortness of breath.  Patient has no abdominal pain on exam.  He does note recently getting a steroid injection in his knee last week but no other medication changes.  Patient was seen in triage and initially had a CTA of his abdomen and pelvis ordered due to his complaint however there are no findings suggestive of aneurysm, dissection or artery narrowing.  Other than a fatty liver he had no other acute findings.  Patient's EKG today shows a right bundle branch block without old to compare.  CBC, BMP are within normal limits.  Troponin is wnl.   Will d/c home.  Concerned that patient symptoms may be related to the recent injection he got in his knee and this is an adverse side effect.  Did recommend he follow-up with cardiologist for ongoing monitoring if symptoms worsen.

## 2017-09-22 NOTE — Telephone Encounter (Signed)
I do not think this is related to his injection. I think the referral that was placed is perfectly appropriate. Im fine with Dr. Paulla Fore to weigh in quickly but I doubt the steroid is the issues. If patient would like to see someone for ED follow up- lets set him up for that in my absence

## 2017-09-24 NOTE — Telephone Encounter (Signed)
Dr. Paulla Fore did call and discuss injection in depth with patient. I called and left a voicemail message asking patient to return my call and I stated that if he would like to schedule an ED follow up with a provider here we would be happy to schedule him. I did confirm the referral to Cardiology also.

## 2017-09-24 NOTE — Telephone Encounter (Signed)
Called and reported this episode to [employer]. I did not release any identifying patient information.

## 2017-09-30 ENCOUNTER — Other Ambulatory Visit: Payer: Self-pay

## 2017-09-30 MED ORDER — EPINEPHRINE 0.3 MG/0.3ML IJ SOAJ: 0.3000 mg | Freq: Once | INTRAMUSCULAR | 2 refills | Status: AC

## 2017-10-02 ENCOUNTER — Encounter: Payer: Self-pay | Admitting: Family Medicine

## 2017-10-07 ENCOUNTER — Other Ambulatory Visit: Payer: Self-pay

## 2017-10-07 MED ORDER — EPINEPHRINE 0.3 MG/0.3ML IJ SOAJ: 0.3000 mg | Freq: Once | INTRAMUSCULAR | 1 refills | Status: AC

## 2017-10-14 ENCOUNTER — Encounter: Payer: Self-pay | Admitting: Neurology

## 2017-10-14 ENCOUNTER — Ambulatory Visit (INDEPENDENT_AMBULATORY_CARE_PROVIDER_SITE_OTHER): Payer: Medicare Other | Admitting: Neurology

## 2017-10-14 VITALS — BP 154/75 | HR 57 | Ht 71.0 in | Wt 290.0 lb

## 2017-10-14 DIAGNOSIS — G4733 Obstructive sleep apnea (adult) (pediatric): Secondary | ICD-10-CM | POA: Diagnosis not present

## 2017-10-14 DIAGNOSIS — G4731 Primary central sleep apnea: Secondary | ICD-10-CM | POA: Diagnosis not present

## 2017-10-14 DIAGNOSIS — G43009 Migraine without aura, not intractable, without status migrainosus: Secondary | ICD-10-CM

## 2017-10-14 DIAGNOSIS — G44019 Episodic cluster headache, not intractable: Secondary | ICD-10-CM | POA: Diagnosis not present

## 2017-10-14 DIAGNOSIS — G4730 Sleep apnea, unspecified: Secondary | ICD-10-CM | POA: Insufficient documentation

## 2017-10-14 MED ORDER — PROPRANOLOL HCL ER 60 MG PO CP24: 60.0000 mg | ORAL_CAPSULE | Freq: Every day | ORAL | 5 refills | Status: DC

## 2017-10-14 NOTE — Progress Notes (Signed)
SLEEP MEDICINE CLINIC   Provider:  Larey Seat, M D  Primary Care Physician:  Craig Olp, MD   Referring Provider: Marin Olp, MD   Chief Complaint  Patient presents with  . Follow-up    pt alone, rm 11, DME [facility]. pt got a Zilretta injection the Left knee he thought his heart was going to jump out of chest. (went to ER) and pt normally doesnt have headaches and for the last 3 weeks he is having headaches daily, some days worse then others. he states that these headaches started happening around the time his machine was fixed at [facility] but was also around the time he got the shot so he is unsure which its related to.     HPI:  Craig Farrell is a 67 y.o. male , seen here as revisit from Dr. Yong Farrell for a re- evaluation,  Craig Farrell has an amazing story to tell.   Apparently he had sleep studies and sleep related medical follow-ups in different locations in a different state.   He had an evaluation of sleep apnea at [facility] sleep-  And was placed in a trailer, where he could not sleep.  He was seen later  by [facility] on 06 February 2012 underwent a CPAP bilevel and ASV titration.  CPAP was initiated at 4 cmH2O after the patient had a documented AHI of 81/h he was initiated on 9 cmH2O pressure but developed central apneas.  His very first polysomnogram quoted by Dr. Thedore Mins was from 06 July 2009.  Apparently CPAP did not help with the resolution of apnea the patient was therefore switched to a bilevel first at 8/4 centimeter then a 10/4 centimeter and then a backup rate of 10 bpm was employed, again central apneas emerged the patient was finally titrated to ASV.  The diagnosis was now complex sleep apnea which improved on ASV to an AHI of 8/hr.  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  Minimum pressure support 4 cm, maximum pressure support 14 cm with a ResMed Swift fracture nasal seal interface.  In the meantime he  had moved to [place] where his ASV machine was not interrogated.  He was actually not treated, he was just given a new S10 machine, which has not been set to be usable for him.  He has basically of brand new machine.  He did receive 1 refill for his CPAP supplies.  He used them with his S9 machine.The supplies are 64-year-old.  Chief complaint according to patient : The patient needs to establish sleep care but he also needs supplies.  His last supplies are over a year old, download from his ASV machine which still works excellently shows 100% compliance, the patient uses it on average 9 hours and 24 minutes at night, expiratory pressure of 4 cmH2O minimum pressure support 4 cmH2O maximum pressure support 14 cmH2O residual AHI is 0.8/hr. He has some high air leaks but these are related to tubing and interface and facial hair.  Sleep habits are as follows: Bedtime is usually between 9 and 10 PM, he usually reads before he retreats to bed he does not have any trouble falling asleep, staying asleep.  He has usually one bathroom break around 2 AM, some nights that will be a second between 4 and 5 AM.  Patient sleeps on his right side only with 2 pillows for head support. His bedroom is cool, quiet and dark. His  wife has noticed significant air leaks that she can sense and she has noted that he can now snore through the CPAP indicating air seal is broken.The couple rises usually at 7 AM, they are keeping the 63 months old granddaughter.   Sleep medical history and family sleep history: The patient's father had sleep apnea and actually underwent a UPPP.  Two uncles also were diagnosed with sleep apnea. Craig Farrell also has sleep apnea and uses a sleep CPAP.  Social history: Married, one daughter, one grandchildren.    Non smoker, non drinker, caffeine : 2 cups in AM, caffeine free coke and tea.  Interval history from 14 October 2017.    Craig Farrell states that he was just recently in the emergency room feeling  his heart thumping in his chest, he was triaged and told that he will be worked up for an abdominal aortic aneurysm, however it took 5 hours before he saw the ED-Dr. and was actually undergoing any testing or physical evaluation.  This took place on March 1. This reaction was likely due to a steroid injection into the left knee.    In the meantime the patient has been very compliant CPAP user.  He used to have a S 9 machine that never  Ever worked for him. [employer] was asked to set him up- he no longer has headaches. He is using an Agricultural engineer- curve ASV new machine ( which had not been working , therfore new )  and new supplies now from [employer]. 100% compliant expiratory pressure is set at 4 cm water, minimum pressure support for and maximum pressure support 14 cmH2O with a residual AHI of 0.2, as of yesterday 13 October 2017, has used the machine 30 out of 30 days, 8 hours 32 minutes on average.  He does have high air leaks but his apnea is extremely well controlled.  His Epworth sleepiness score was endorsed at 3 points, fatigue severity was not endorsed, geriatric depression score was endorsed 1 out of 15 points. No longer having headaches.       Review of Systems: Out of a complete 14 system review, the patient complains of only the following symptoms, and all other reviewed systems are negative. snoring through ASV, needs new supplies.    Epworth score 3 from 8 hr. , Fatigue severity score  23 from 46/ hr. , depression score 1/ 15 , no longer nocturia, less headaches or sleep fragmentation. Sleeps 6-7 hours through the night..  Cluster headaches improved on CPAP- will use a calcium Farrell blocker or beta blocker .    Social History   Socioeconomic History  . Marital status: Married    Spouse name: Not on file  . Number of children: 1  . Years of education: 75  . Highest education level: Not on file  Occupational History  . Not on file  Social Needs  . Financial resource  strain: Not on file  . Food insecurity:    Worry: Not on file    Inability: Not on file  . Transportation needs:    Medical: Not on file    Non-medical: Not on file  Tobacco Use  . Smoking status: Never Smoker  . Smokeless tobacco: Never Used  Substance and Sexual Activity  . Alcohol use: No  . Drug use: No  . Sexual activity: Not on file  Lifestyle  . Physical activity:    Days per week: Not on file    Minutes per session: Not  on file  . Stress: Not on file  Relationships  . Social connections:    Talks on phone: Not on file    Gets together: Not on file    Attends religious service: Not on file    Active member of club or organization: Not on file    Attends meetings of clubs or organizations: Not on file    Relationship status: Not on file  . Intimate partner violence:    Fear of current or ex partner: Not on file    Emotionally abused: Not on file    Physically abused: Not on file    Forced sexual activity: Not on file  Other Topics Concern  . Not on file  Social History Narrative   Married 1973. 1 child 23 in [place]- will be in [place] still. 61 month old grandbaby 04/2017.       3 years of college. 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       Two cups caffeine daily.      Right-handed.    Family History  Problem Relation Age of Onset  . Arthritis Mother   . Colon cancer Mother        late 76s  . Arthritis Sister   . Arthritis Sister   . Heart failure Father   . Parkinson's disease Father   . Dementia Father   . Colon cancer Maternal Aunt   . Healthy Child     Past Medical History:  Diagnosis Date  . Allergic rhinitis    astelin  . Arthritis    Hips and shoulders. celebrex 200mg  BID. plans to cut down with medicare costs  . Blood in stool    augmentin  . Erectile dysfunction    cialis 20mg   . Hx of diverticulitis of colon   . Hypertension     lisinopril 10mg   . Hypothyroidism    synthroid 25 mcg  . OSA on CPAP   . Sleep apnea    ASV machine  . Tubular adenoma of colon    2014- was told q3 years- needs update. likely adenoma  . UTI (urinary tract infection)    x1- in hospital.   . Yellow jacket sting allergy    epi pens    Past Surgical History:  Procedure Laterality Date  . HAND SURGERY Right   . left foot surgery     fracture related  . left knee surgery     1980    Current Outpatient Medications  Medication Sig Dispense Refill  . acetaminophen (TYLENOL) 500 MG tablet Take by mouth as needed.    . ASPIRIN 81 PO Take 81 mg by mouth daily.     . Azelastine HCl 0.15 % SOLN SPRAY 2 SPRAY BY INTRANASAL ROUTE EVERY DAY IN EACH NOSTRIL  0  . celecoxib (CELEBREX) 200 MG capsule Take 200 mg by mouth daily.    . Cholecalciferol (GNP VITAMIN D MAXIMUM STRENGTH) 2000 units TABS Take 2,000 Units by mouth 2 (two) times daily.    Marland Kitchen lisinopril (PRINIVIL,ZESTRIL) 10 MG tablet TAKE 1 TABLET BY MOUTH EVERY DAY 90 tablet 1  . tadalafil (CIALIS) 20 MG tablet Take 1 tablet (20 mg total) by mouth as needed. 10 tablet 5   No current facility-administered medications for this visit.     Allergies as of 10/14/2017 - Review Complete 10/14/2017  Allergen Reaction Noted  .  Yellow jacket venom [bee venom] Anaphylaxis 05/07/2017  . Zilretta [triamcinolone acetonide] Palpitations 10/14/2017  . Augmentin [amoxicillin-pot clavulanate] Other (See Comments) 05/07/2017  . Ibuprofen Other (See Comments) 05/07/2017  . Levaquin [levofloxacin] Diarrhea 05/07/2017  . Oxycodone Nausea And Vomiting 05/07/2017  . Prednisone Other (See Comments) 05/07/2017  . Sudafed [pseudoephedrine] Rash 05/07/2017  . Vioxx [rofecoxib] Rash 05/07/2017    Vitals: BP (!) 154/75   Pulse (!) 57   Ht 5\' 11"  (1.803 m)   Wt 290 lb (131.5 kg)   BMI 40.45 kg/m  Last Weight:  Wt Readings from Last 1 Encounters:  10/14/17 290 lb (131.5 kg)   BCW:UGQB mass index is  40.45 kg/m.     Last Height:   Ht Readings from Last 1 Encounters:  10/14/17 5\' 11"  (1.803 m)    Physical exam:  General: The patient is awake, alert and appears not in acute distress. The patient is well groomed. Head: Normocephalic, atraumatic. Neck is supple. Mallampati 2-I was able to visualize the uvula, but the patient is a very low soft palate, narrow angle, and lateral pillows- this is a crowded upper airway. neck circumference: 19.5 . Nasal airflow congested, TMJ is evident . Retrognathia is  Not seen.  Cardiovascular:  Regular rate and rhythm, without  murmurs or carotid bruit, and without distended neck veins. Respiratory: Lungs are clear to auscultation. Skin:  Without evidence of edema, or rash Trunk: BMI is 40.5  Neurologic exam : The patient is awake and alert, oriented to place and time.   Attention span & concentration ability appears normal.  Speech is fluent,  without dysarthria, dysphonia or aphasia.  Mood and affect are appropriate.  Cranial nerves: Pupils are equal and briskly reactive to light. Funduscopic exam without evidence of pallor or edema.  Extraocular movements  in vertical and horizontal planes intact and without nystagmus. Visual fields by finger perimetry are intact. Hearing to finger rub intact.   Facial sensation intact to fine touch.  Facial motor strength is symmetric and tongue and uvula move midline. Shoulder shrug was symmetrical.   Motor exam:   Normal tone, muscle bulk and symmetric strength in all extremities. Sensory:  Fine touch, pinprick and vibration were tested in all extremities. Proprioception tested in the upper extremities was normal. Coordination:  Finger-to-nose maneuver  normal without evidence of ataxia, dysmetria or tremor. Gait and station: Patient walks without assistive device and is able unassisted to climb up to the exam table. Strength within normal limits.Stance is stable and normal.  Tandem gait is unfragmented. Turns  with 3 Steps. Romberg testing is negative. Deep tendon reflexes: in the  upper and lower extremities are symmetric and intact.    Assessment:  After physical and neurologic examination, review of laboratory studies,  Personal review of imaging studies, reports of other /same  Imaging studies, results of polysomnography and / or neurophysiology testing and pre-existing records as far as provided in visit., my assessment is   1) I did not need for Mr. Rafferty at this point to undergo a new sleep study.  His ASV works well- he got  new supplies- [employer] , FX swift nasal pillow, headgear,  his newer air curve s 10 is a ASV.  2) no longer nocturia , sleepiness or fatigue.  PAP success/Epworth score 3 from 8 hr. , Fatigue severity score  23 from 46/ hr. , depression score 1/ 15 , no longer nocturia, less headaches or sleep fragmentation. Sleeps 6-7 hours through the night.Marland Kitchen  3) new described Cluster headaches improved on CPAP- will use a calcium Farrell blocker or beta blocker . This headache is not sinus related. Likely a subtype of migraine.     The patient was advised of the nature of the diagnosed disorder , the treatment options and the  risks for general health and wellness arising from not treating the condition.   I spent more than 30  minutes of face to face time with the patient.  Greater than 50% of time was spent in counseling and coordination of care. We have discussed the diagnosis and differential and I answered the patient's questions.    Plan:  Treatment plan and additional workup :  The patient's specifically set ASV machine with EpAP of 4 cm water, minimum pressure support additional 4 cm water, maximum pressure support 14 cm water. He takes a tylenol PM every night. I will offer a propanolol XR for first trial.     Larey Seat, MD 7/57/9728, 2:06 PM  Certified in Neurology by ABPN Certified in [place] by [facility] [address],  [place], [place] [address]

## 2017-10-16 ENCOUNTER — Other Ambulatory Visit: Payer: Self-pay

## 2017-10-16 ENCOUNTER — Encounter: Payer: Medicare Other | Admitting: Gastroenterology

## 2017-10-21 ENCOUNTER — Encounter: Payer: Self-pay | Admitting: Sports Medicine

## 2017-10-21 ENCOUNTER — Encounter: Payer: Self-pay | Admitting: Family Medicine

## 2017-10-21 ENCOUNTER — Ambulatory Visit: Payer: Medicare Other | Admitting: Physician Assistant

## 2017-10-21 ENCOUNTER — Ambulatory Visit (INDEPENDENT_AMBULATORY_CARE_PROVIDER_SITE_OTHER): Payer: Medicare Other | Admitting: Family Medicine

## 2017-10-21 ENCOUNTER — Other Ambulatory Visit: Payer: PRIVATE HEALTH INSURANCE

## 2017-10-21 ENCOUNTER — Other Ambulatory Visit (INDEPENDENT_AMBULATORY_CARE_PROVIDER_SITE_OTHER): Payer: Medicare Other

## 2017-10-21 ENCOUNTER — Ambulatory Visit (INDEPENDENT_AMBULATORY_CARE_PROVIDER_SITE_OTHER): Payer: Medicare Other | Admitting: Sports Medicine

## 2017-10-21 ENCOUNTER — Ambulatory Visit (INDEPENDENT_AMBULATORY_CARE_PROVIDER_SITE_OTHER): Payer: Medicare Other

## 2017-10-21 VITALS — BP 110/66 | HR 64 | Ht 71.0 in | Wt 283.2 lb

## 2017-10-21 VITALS — BP 110/66 | HR 64 | Ht 71.0 in | Wt 283.3 lb

## 2017-10-21 DIAGNOSIS — R42 Dizziness and giddiness: Secondary | ICD-10-CM | POA: Diagnosis not present

## 2017-10-21 DIAGNOSIS — R7989 Other specified abnormal findings of blood chemistry: Secondary | ICD-10-CM

## 2017-10-21 DIAGNOSIS — M79672 Pain in left foot: Secondary | ICD-10-CM | POA: Diagnosis not present

## 2017-10-21 DIAGNOSIS — E785 Hyperlipidemia, unspecified: Secondary | ICD-10-CM

## 2017-10-21 DIAGNOSIS — R972 Elevated prostate specific antigen [PSA]: Secondary | ICD-10-CM

## 2017-10-21 DIAGNOSIS — E039 Hypothyroidism, unspecified: Secondary | ICD-10-CM | POA: Diagnosis not present

## 2017-10-21 DIAGNOSIS — M10072 Idiopathic gout, left ankle and foot: Secondary | ICD-10-CM

## 2017-10-21 DIAGNOSIS — R002 Palpitations: Secondary | ICD-10-CM

## 2017-10-21 DIAGNOSIS — M1712 Unilateral primary osteoarthritis, left knee: Secondary | ICD-10-CM | POA: Diagnosis not present

## 2017-10-21 DIAGNOSIS — R945 Abnormal results of liver function studies: Secondary | ICD-10-CM | POA: Diagnosis not present

## 2017-10-21 DIAGNOSIS — I10 Essential (primary) hypertension: Secondary | ICD-10-CM | POA: Diagnosis not present

## 2017-10-21 LAB — COMPREHENSIVE METABOLIC PANEL
ALT: 31 U/L (ref 0–53)
AST: 28 U/L (ref 0–37)
Albumin: 4.3 g/dL (ref 3.5–5.2)
Alkaline Phosphatase: 52 U/L (ref 39–117)
BILIRUBIN TOTAL: 0.9 mg/dL (ref 0.2–1.2)
BUN: 14 mg/dL (ref 6–23)
CO2: 24 meq/L (ref 19–32)
CREATININE: 0.76 mg/dL (ref 0.40–1.50)
Calcium: 10 mg/dL (ref 8.4–10.5)
Chloride: 103 mEq/L (ref 96–112)
GFR: 108.96 mL/min (ref >60.00)
GLUCOSE: 87 mg/dL (ref 70–99)
Potassium: 4.4 mEq/L (ref 3.5–5.1)
Sodium: 138 mEq/L (ref 135–145)
Total Protein: 7.2 g/dL (ref 6.0–8.3)

## 2017-10-21 LAB — URIC ACID: Uric Acid, Serum: 8.3 mg/dL — ABNORMAL HIGH (ref 4.0–7.8)

## 2017-10-21 LAB — TSH: TSH: 2.51 u[IU]/mL (ref 0.35–4.50)

## 2017-10-21 LAB — LDL CHOLESTEROL, DIRECT: LDL DIRECT: 105 mg/dL

## 2017-10-21 LAB — PSA: PSA: 2.92 ng/mL (ref 0.10–4.00)

## 2017-10-21 NOTE — Assessment & Plan Note (Signed)
Orthostatics negative today. Will keep BP medications the same with good control.

## 2017-10-21 NOTE — Addendum Note (Signed)
Addended by: Kayren Eaves T on: 10/21/2017 12:18 PM   Modules accepted: Orders

## 2017-10-21 NOTE — Patient Instructions (Signed)

## 2017-10-21 NOTE — Patient Instructions (Addendum)
Discuss orthotics at follow up with Dr. Paulla Fore- just spoke with him and looks like it requires prior auth  We will call you within a week or two about your referral for cardiac event monitor. If you do not hear within 3 weeks, give [place] a call.   I tried to order an echocardiogram (ultrasound) but insurance will not cover this under palpitations or lightheadedness so we will wait to see what Dr. Harrington Challenger says  If orthostatic vital signs- I will reach out to you about potentially reducing lisinopril

## 2017-10-21 NOTE — Progress Notes (Signed)
[name], [place] [facility]  Merwyn Hodapp - 67 y.o. male MRN [id number]  Date of birth: [birth year]  Visit Date: 10/21/2017  PCP: Marin Olp, MD   Referred by: Marin Olp, MD  Scribe for today's visit: Josepha Pigg, CMA     SUBJECTIVE:  Tasia Catchings "[name]" is here for Follow-up (osteoarthritis L knee)  09/11/2017: Compared to the last office visit on 09/08/17, his previously described L knee pain symptoms are worsening he is now having pain from left knee to outside of calf.  Current symptoms are moderate & are radiating to outside of calf He has been taking tylenol and celebrex with little relief.   10/21/2017: Compared to the last office visit, his previously described symptoms are improving. He still has occasional twinge of pain but less severe and less often.  Current symptoms are mild & are nonradiating He has been taking Celebrex prn and Tylenol prn with some relief.  He had Zilretta injection 09/11/17 and was then seen at the ED for palpitations and told it was related to Zilretta injection. He continues to have HA, light-headedness, and about 6 episodes of palpitations daily. He does not recall having any issues with this prior to Zilretta injection.   He c/o L foot pain x 2 days. He broke L foot in the past. MOI is unknown.  He has noticed increased swelling and erythema. He denies increased warmth. Pain is mostly on the top of the foot.  Pain is worse when weight bearing and even worse when not wearing shoes.  He is currently wearing a boot.    ROS Reports night time disturbances. Denies fevers, chills, or night sweats. Denies unexplained weight loss. Reports personal history of cancer. Denies changes in bowel or bladder habits. Denies recent unreported falls. Denies new or worsening dyspnea or wheezing. Denies headaches or dizziness.  Reports numbness, tingling or weakness  In the  extremities.  Denies dizziness or presyncopal episodes Reports lower extremity edema    HISTORY & PERTINENT PRIOR DATA:  Prior History reviewed and updated per electronic medical record.  Significant/pertinent history, findings, studies include:  reports that he has never smoked. He has never used smokeless tobacco. Recent Labs    10/21/17 1027  LABURIC 8.3*   08/18/2017 - orthovisc is covered by Medicare 80/20 after deductible has been met, no copay, OOP does not apply. [name] is part F supplement and insurance responsibility is 100%.  09/10/17 - Zilretta: Medicare will cover 80%, deductible has been met. [employer] will cover medicare copay and coinsurance. - BSC No problems updated.  OBJECTIVE:  VS:  HT:5' 11" (180.3 cm)   WT:283 lb 3.2 oz (128.5 kg)  BMI:39.52    BP:110/66  HR:64bpm  TEMP: ( )  RESP:97 %   PHYSICAL EXAM: Constitutional: WDWN, Non-toxic appearing. Psychiatric: Alert & appropriately interactive.  Not depressed or anxious appearing. Respiratory: No increased work of breathing.  Trachea Midline Eyes: Pupils are equal.  EOM intact without nystagmus.  No scleral icterus  Vascular Exam: warm to touch  lower extremity neuro exam: unremarkable  MSK Exam: Left foot has marked swelling and small amount of erythema over the midfoot.  He has pain with any type of passive or active forefoot range of motion.  DP PT pulses are present.   ASSESSMENT & PLAN:   1. Left foot pain   2. Acute idiopathic gout of left foot   3. Primary osteoarthritis of  left knee     PLAN: Symptoms are most consistent with acute gout flare of the midfoot.  Ultrasound guided injection was performed today and will plan to send for uric acid levels but did discuss that there is the possibility of a false normal at this time.  He is doing well from his knee and has further follow-up with Dr. Yong Channel as well as with cardiology for the ongoing opticians is experiencing.  Likelihood of these  palpitations be related directly to the Zilretta injections are slim but have been reported to the manufacturer.  Follow-up: Return in about 6 weeks (around 12/04/2017).      Please see additional documentation for Objective, Assessment and Plan sections. Pertinent additional documentation may be included in corresponding procedure notes, imaging studies, problem based documentation and patient instructions. Please see these sections of the encounter for additional information regarding this visit.  CMA/ATC served as Education administrator during this visit. History, Physical, and Plan performed by medical provider. Documentation and orders reviewed and attested to.      Gerda Diss, [facility] Physician

## 2017-10-21 NOTE — Addendum Note (Signed)
Addended by: Frutoso Chase A on: 10/21/2017 12:23 PM   Modules accepted: Orders

## 2017-10-21 NOTE — Procedures (Signed)
X-Rays obtained at [facility] Interpreted by myself Gerda Diss, DO) during office visit.  Results were reviewed with the patient at the time of the visit.   3 VIEW X-RAY of: Left foot  FINDINGS:  Moderate degenerative changes of the midfoot including of TMT joints.  There is no significant widening of the Lisfranc joint although it cannot be fully appreciated given the osteophytic changes.    IMPRESSION:  Marked midfoot degenerative change

## 2017-10-21 NOTE — Progress Notes (Signed)
Subjective:  Craig Farrell is a 67 y.o. year old very pleasant male patient who presents for/with See problem oriented charting ROS- has had some palpitations and lightheadedness. No chest pain or shortness of breath.    Past Medical History-  Patient Active Problem List   Diagnosis Date Noted  . Morbid obesity ([place]) 05/07/2017    Priority: High  . Hyperlipidemia 06/23/2017    Priority: Medium  . BPH associated with nocturia 06/16/2017    Priority: Medium  . Primary osteoarthritis of left knee 05/07/2017    Priority: Medium  . OSA on CPAP     Priority: Medium  . Arthritis     Priority: Medium  . Hypertension     Priority: Medium  . Hypothyroidism     Priority: Medium  . History of adenomatous polyp of colon     Priority: Medium  . Tinnitus 05/07/2017    Priority: Low  . Allergic rhinitis     Priority: Low  . Yellow jacket sting allergy     Priority: Low  . Erectile dysfunction     Priority: Low  . Sleep apnea treated with nocturnal BiPAP 10/14/2017  . Episodic cluster headache, not intractable 10/14/2017  . Migraine without aura and without status migrainosus, not intractable 10/14/2017  . Primary osteoarthritis of left hip 07/28/2017  . Hypersomnia with sleep apnea 07/15/2017  . 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 Encounter for counseling on adaptive servo-ventilation (ASV) use 07/15/2017  . Class 3 severe obesity due to excess calories without serious comorbidity in adult [facility]) 07/15/2017    Medications- reviewed and updated Current Outpatient Medications  Medication Sig Dispense Refill  . acetaminophen (TYLENOL) 500 MG tablet Take by mouth as needed.    . ASPIRIN 81 PO Take 81 mg by mouth daily.     . Azelastine HCl 0.15 % SOLN SPRAY 2 SPRAY BY INTRANASAL ROUTE EVERY DAY IN EACH NOSTRIL  0  . celecoxib (CELEBREX) 200 MG capsule Take 200 mg by mouth daily.    . Cholecalciferol (GNP VITAMIN D MAXIMUM STRENGTH) 2000 units TABS Take 2,000 Units by mouth 2 (two)  times daily.    Marland Kitchen lisinopril (PRINIVIL,ZESTRIL) 10 MG tablet TAKE 1 TABLET BY MOUTH EVERY DAY 90 tablet 1  . propranolol ER (INDERAL LA) 60 MG 24 hr capsule Take 1 capsule (60 mg total) by mouth at bedtime. 30 capsule 5  . tadalafil (CIALIS) 20 MG tablet Take 1 tablet (20 mg total) by mouth as needed. 10 tablet 5   No current facility-administered medications for this visit.     Objective: BP 110/66   Pulse 64   Ht 5\' 11"  (1.803 m)   Wt 283 lb 4.8 oz (128.5 kg)   SpO2 97%   BMI 39.51 kg/m  Gen: NAD, resting comfortably CV: RRR no murmurs rubs or gallops Lungs: CTAB no crackles, wheeze, rhonchi Abdomen: soft/nontender/nondistended/normal bowel sounds. No rebound or guarding. No pulsatility noted in abdomen Ext: no edema Skin: warm, dry Neuro: normal gait  Assessment/Plan:  Elevated PSA S: PSA trending up Lab Results  Component Value Date   PSA 2.92 10/21/2017   PSA 2.49 06/23/2017   PSA 1.03 10/26/2014  A/P: will refer to urology given almost 2 point increase over last 2-3 years.   Palpitations Dizziness S: Palpitations started sometime after long acting steroid injection on 08/30/17. I doubt this was the cause of his symptoms. He attributes all of his symptoms to this (lightheadednesa nd headaches as well)- once again my  suspicion is low of the correlation here although he does have sensitivity to steroids.   Early march- Was feeling his heartbeat in his abdomen.  PVCs noted on monitior in ER back to march 1st. Had CTA abd/pelvis and no obvious aneurysm, dissectoin, arteriral narrowing. Troponin negative at that time. Was advised to see cardiology and he has appointment in 2 more weeks with Dr. Harrington Challenger. Still having these episodes but less often- 2 to 3 every 2-3 days and less severe  Has also dealth with some lightheadedness but this seems to precede the palpitations. He had 2-3 episodes of lightheadedness this morning. States has had similar most days for last 2-3 months (when  evaluated in the ER). No room spinning. Lasts 5-10 seconds then resolves- mostly with standing but not always. Has not had any driving issues. Doing 3-5 [place] bottle water per day. Is peeing more. Feels like dizziness slightly better. For 2-3 days took lupin brand lisijnopril which he wonders if contributed to symptoms  Has also had some headaches which he discussed with his neurologist who thought they wer cluster headaches and suggested beta blocker- he has not started yet- wanting to feel better first or have his visit with cardiology at least A/P: I suspect his palpitations are due to PVCs. They do not line up with his lightheadedness though. I want to do a 72 hour holter monitor and have him journal times of symptoms. Make sure no arhythmia as cause, also want to rule out bradycardia which propranolol could worsen and could cause lightheadedness (I doubt bradycardia is the cause of symptoms). Considered echocardiogram for PVCs but medicare required waver for lightheadedness and palpitations. We will let him see cardiology for his opinion. He wants to talk with them first as well before starting the propranolol- I suspect it will help his palpitations and headaches. Orthostatics negative.   Hyperlipidemia No rx- working on getting weigh to 250. Lipids trending down form near 130 to closer to 100 with weight loss- would advise further efforts to improve lifestyle and healthy eating.   Hypothyroidism Lab Results  Component Value Date   TSH 2.51 10/21/2017  has been off synthroid for 3 months - had been started by prior provider.  will continue to monitor q3-6 months.   Hypertension Orthostatics negative today. Will keep BP medications the same with good control.    Future Appointments  Date Time Provider [place]  11/05/2017  8:40 AM Fay Records, MD [facility]  11/23/2017  2:00 PM Ladene Artist, MD [facility]  12/04/2017  4:00 PM Gerda Diss, DO  [facility]  01/19/2018  3:30 PM Yong Channel Brayton Mars, MD [facility]   Lab/Order associations: Palpitations - Plan: TSH, Cardiac event monitor  Hyperlipidemia, unspecified hyperlipidemia type - Plan: Direct LDL  Return precautions advised.  [name], MD

## 2017-10-21 NOTE — Assessment & Plan Note (Signed)
No rx- working on getting weigh to 250. Lipids trending down form near 130 to closer to 100 with weight loss- would advise further efforts to improve lifestyle and healthy eating.

## 2017-10-21 NOTE — Progress Notes (Signed)
PROCEDURE NOTE:  Ultrasound Guided: Injection: Left mid foot Images were obtained and interpreted by myself, Teresa Coombs, DO  Images have been saved and stored to PACS system. Images obtained on: GE S7 Ultrasound machine    ULTRASOUND FINDINGS:  Midfoot degenerative spurring  DESCRIPTION OF PROCEDURE:  The patient's clinical condition is marked by substantial pain and/or significant functional disability. Other conservative therapy has not provided relief, is contraindicated, or not appropriate. There is a reasonable likelihood that injection will significantly improve the patient's pain and/or functional impairment.   After discussing the risks, benefits and expected outcomes of the injection and all questions were reviewed and answered, the patient wished to undergo the above named procedure.  Verbal consent was obtained.  The ultrasound was used to identify the target structure and adjacent neurovascular structures. The skin was then prepped in sterile fashion and the target structure was injected under direct visualization using sterile technique as below:  PREP: Alcohol and Ethel Chloride APPROACH: direct, single injection, 25g 1.5 in. INJECTATE: 0.5 cc 0.5% Marcaine and 0.5 cc 80mg /mL DepoMedrol ASPIRATE: None DRESSING: Band-Aid  Post procedural instructions including recommending icing and warning signs for infection were reviewed.    This procedure was well tolerated and there were no complications.   IMPRESSION: Succesful Ultrasound Guided: Injection

## 2017-10-21 NOTE — Assessment & Plan Note (Signed)
Lab Results  Component Value Date   TSH 2.51 10/21/2017  has been off synthroid for 3 months - had been started by prior provider.  will continue to monitor q3-6 months.

## 2017-10-22 ENCOUNTER — Telehealth: Payer: Self-pay

## 2017-10-22 ENCOUNTER — Other Ambulatory Visit: Payer: Self-pay

## 2017-10-22 DIAGNOSIS — R972 Elevated prostate specific antigen [PSA]: Secondary | ICD-10-CM

## 2017-10-22 NOTE — Telephone Encounter (Addendum)
Pt called to get lab results; pt also would like to know his  Orthostatic BP values were yesterday; Row Name  10/21/17 1228  10/21/17 1211        Vital Signs  BP  -  110/66        Pulse  -  64        Patient Position (if appropriate)  Orthostatic Vitals  -        Oxygen Therapy  SpO2  -  97 %        Orthostatic Lying   BP- Lying  112/76  -        Pulse- Lying  57  -        Orthostatic Sitting  BP- Sitting  126/64  -        Pulse- Sitting  69  -        Orthostatic Standing at 0 minutes  BP- Standing at 0 minutes  118/60  -        Pulse- Standing at 0 minutes  66  -        Orthostatic Standing at 3 minutes  BP- Standing at 3 minutes  104/70  -        Pulse- Standing at 3 minutes             Lab results per Dr [name], "Your CBC was normal (blood counts, infection fighting cells, platelets). Your CMET was normal (kidney, liver, and electrolytes, blood sugar). Your cholesterol has improved from 126 now down to 105 with some weight loss- lets keep pushing toward your goal of 250 on weight and see if we can get this under 100- great job! Your thyroid was normal.Your PSA continues to trend up. Jamie/Aria can you go ahead and refer him to urology under elevated PSA. It is still technically in normal range but is trending up and from 2 years ago up from 1.03 to 2.92."  The pt would like these values sent to my chart; will route to office; the pt can be contacted at [phone]; will route to office for placement in my chart .

## 2017-10-22 NOTE — Telephone Encounter (Signed)
Called and spoke to patient and tried to give him his lab results but he stated he had already read them in [place]. I informed him that I referred him to [facility] per Dr. Yong Channel. Patient Verbalized understanding.

## 2017-10-22 NOTE — Telephone Encounter (Signed)
Called and left a message on voicemail to call office back

## 2017-10-22 NOTE — Telephone Encounter (Signed)
Attempted to contact pt with lab results and orthostatic values (see CRM # [id number]); will also route to office because pt would like to have this information sent to my chart.

## 2017-11-05 ENCOUNTER — Encounter: Payer: Self-pay | Admitting: Internal Medicine

## 2017-11-05 ENCOUNTER — Ambulatory Visit (INDEPENDENT_AMBULATORY_CARE_PROVIDER_SITE_OTHER): Payer: Medicare Other | Admitting: Internal Medicine

## 2017-11-05 VITALS — BP 122/64 | HR 78 | Ht 71.0 in | Wt 280.8 lb

## 2017-11-05 DIAGNOSIS — I10 Essential (primary) hypertension: Secondary | ICD-10-CM

## 2017-11-05 DIAGNOSIS — R42 Dizziness and giddiness: Secondary | ICD-10-CM

## 2017-11-05 DIAGNOSIS — R002 Palpitations: Secondary | ICD-10-CM | POA: Diagnosis not present

## 2017-11-05 DIAGNOSIS — E7800 Pure hypercholesterolemia, unspecified: Secondary | ICD-10-CM

## 2017-11-05 MED ORDER — ROSUVASTATIN CALCIUM 5 MG PO TABS: 5.0000 mg | ORAL_TABLET | Freq: Every day | ORAL | 3 refills | Status: DC

## 2017-11-05 NOTE — Progress Notes (Signed)
Cardiology Office Note   Date:  11/05/2017   ID:  Craig Farrell, DOB [birth year], MRN [id number]  PCP:  Marin Olp, MD  Cardiologist:   Dorris Carnes, MD   Pt referred by Dr Yong Channel for palpitations     History of Present Illness: Craig Farrell is a 67 y.o. male with a history of palpitations, dizziness and near syncope Pt says in Feb he had an injuction to his L knee with a steroid  Told that drugwouldn't get released systemically   He had allergic type reaction to prednisone in past.    The next day he deveoped a severe HA.  The following week  he developed severe heart pounding and dizzienss.  Abdomen was pulsating  He was concerned about aneurysm.  Went to ED   CT was done  Neg for aneurysm.  Note minmal plaquing of aorta noted.   Pt says PVCs were on monitor     Pt was sent home from ED  Since that visit his symptoms have slowly improved   Now he feels about 6 to 10 skips per day   HA is less severe   He has not passed out  STill dizzy with standing    Prior to injection of knee he had felt fine.  No CP  No palpitatoins   No SOB     Current Meds  Medication Sig  . acetaminophen (TYLENOL) 500 MG tablet Take by mouth as needed.  . ASPIRIN 81 PO Take 81 mg by mouth daily.   . Azelastine HCl 0.15 % SOLN SPRAY 2 SPRAY BY INTRANASAL ROUTE EVERY DAY IN EACH NOSTRIL  . celecoxib (CELEBREX) 200 MG capsule Take 200 mg by mouth daily.  . Cholecalciferol (GNP VITAMIN D MAXIMUM STRENGTH) 2000 units TABS Take 2,000 Units by mouth 2 (two) times daily.  Marland Kitchen lisinopril (PRINIVIL,ZESTRIL) 10 MG tablet TAKE 1 TABLET BY MOUTH EVERY DAY  . tadalafil (CIALIS) 20 MG tablet Take 1 tablet (20 mg total) by mouth as needed.     Allergies:   Yellow jacket venom [bee venom]; Zilretta [triamcinolone acetonide]; Augmentin [amoxicillin-pot clavulanate]; Ibuprofen; Levaquin [levofloxacin]; Oxycodone; Prednisone; Sudafed [pseudoephedrine]; and Vioxx [rofecoxib]   Past Medical History:  Diagnosis Date  .  Allergic rhinitis    astelin  . Arthritis    Hips and shoulders. celebrex 200mg  BID. plans to cut down with medicare costs  . Blood in stool    augmentin  . Erectile dysfunction    cialis 20mg   . Hx of diverticulitis of colon   . Hypertension    lisinopril 10mg   . Hypothyroidism    synthroid 25 mcg  . OSA on CPAP   . Sleep apnea    ASV machine  . Tubular adenoma of colon    2014- was told q3 years- needs update. likely adenoma  . UTI (urinary tract infection)    x1- in hospital.   . Yellow jacket sting allergy    epi pens    Past Surgical History:  Procedure Laterality Date  . HAND SURGERY Right   . left foot surgery     fracture related  . left knee surgery     1980     Social History:  The patient  reports that he has never smoked. He has never used smokeless tobacco. He reports that he does not drink alcohol or use drugs.   Family History:  The patient's family history includes Arthritis in his mother, sister, and sister; Colon cancer in  his maternal aunt and mother; Dementia in his father; Healthy in his child; Heart failure in his father; Parkinson's disease in his father.    ROS:  Please see the history of present illness. All other systems are reviewed and  Negative to the above problem except as noted.    PHYSICAL EXAM: VS:  BP 122/64   Pulse 78   Ht 5\' 11"  (1.803 m)   Wt 280 lb 12.8 oz (127.4 kg)   SpO2 93%   BMI 39.16 kg/m   GEN: Morbidly obese 67 yo , in no acute distress  HEENT: normal  Neck: no JVD, carotid bruits, or masses Cardiac: RRR; no murmurs, rubs, or gallops,no edema  Respiratory:  clear to auscultation bilaterally, normal work of breathing GI: Obese  soft, nontender, nondistended, + BS  No hepatomegaly  MS: no deformity Moving all extremities   Skin: warm and dry, no rash Neuro:  Strength and sensation are intact Psych: euthymic mood, full affect   EKG:  EKG is Not ordered today.  On 3/1  SB 54 bpm  RBBB   Nonspecific ST changes   Old   Lipid Panel    Component Value Date/Time   CHOL 216 (H) 06/23/2017 1018   TRIG 270.0 (H) 06/23/2017 1018   HDL 42.80 06/23/2017 1018   CHOLHDL 5 06/23/2017 1018   VLDL 54.0 (H) 06/23/2017 1018   LDLDIRECT 105.0 10/21/2017 1027      Wt Readings from Last 3 Encounters:  11/05/17 280 lb 12.8 oz (127.4 kg)  10/21/17 283 lb 3.2 oz (128.5 kg)  10/21/17 283 lb 4.8 oz (128.5 kg)      ASSESSMENT AND PLAN:  1  Palpitations / dizzines   Improving   It sounds like triggered by injection of steroid the week prior   Suspicious for allergic type reaction.   Symptoms have been slowly imporving which again would be expected   Exam without volume increase  No ectopy noted  With his symtoms improving I would follow   Stay hydrated  Increase salt intake for now   No furhter testing now    2  HL  LDL was 126    I would recomm starting statin  Dr Yong Channel had also recomm  I would recomm low dose    5 mg Crestor   F/U lipids in 8 wks  3  HTN   BP is OK  4  Morbid obesithy  Needs to lose wt  Can cycle  Encouraged this and diet  Will f/u with labs.  Otherwise will be available as needed     Current medicines are reviewed at length with the patient today.  The patient does not have concerns regarding medicines.  Signed, Dorris Carnes, MD  11/05/2017 8:51 AM    [address], [place], [place]  [id number] Phone: [phone]; Fax: [phone]

## 2017-11-05 NOTE — Patient Instructions (Signed)
Your physician has recommended you make the following change in your medication:  1.) start rosuvastatin (Crestor) 5 mg tablet- one tablet once a day  Your physician recommends that you return for a FASTING lipid profile: in 2 months.  After that Dr. Yong Channel will follow your cholesterol levels.  Your physician recommends that you schedule a follow-up appointment as needed with Dr. Harrington Challenger.

## 2017-11-16 ENCOUNTER — Encounter: Payer: Self-pay | Admitting: Sports Medicine

## 2017-11-17 ENCOUNTER — Other Ambulatory Visit: Payer: Self-pay | Admitting: Internal Medicine

## 2017-11-18 NOTE — Telephone Encounter (Signed)
2  HL  LDL was 126    I would recomm starting statin  Dr Yong Channel had also recomm  I would recomm low dose    5 mg Crestor   F/U lipids in 8 wks

## 2017-11-23 ENCOUNTER — Encounter: Payer: Self-pay | Admitting: Gastroenterology

## 2017-11-23 ENCOUNTER — Ambulatory Visit (AMBULATORY_SURGERY_CENTER): Payer: Medicare Other | Admitting: Gastroenterology

## 2017-11-23 ENCOUNTER — Other Ambulatory Visit: Payer: Self-pay

## 2017-11-23 VITALS — BP 131/69 | HR 52 | Temp 98.7°F | Resp 20 | Ht 71.0 in | Wt 280.0 lb

## 2017-11-23 DIAGNOSIS — Z86010 Personal history of colonic polyps: Secondary | ICD-10-CM

## 2017-11-23 DIAGNOSIS — D120 Benign neoplasm of cecum: Secondary | ICD-10-CM | POA: Diagnosis not present

## 2017-11-23 MED ORDER — SODIUM CHLORIDE 0.9 % IV SOLN: 500.0000 mL | Freq: Once | INTRAVENOUS | Status: DC

## 2017-11-23 NOTE — Progress Notes (Signed)
Pt's states no medical or surgical changes since previsit or office visit. 

## 2017-11-23 NOTE — Progress Notes (Signed)
Pt abdomen was soft, only passd small amount of flatus.  Pt denies pain.  After 20 mins pt to the restroom to sit on the toilet. maw

## 2017-11-23 NOTE — Progress Notes (Signed)
Report given to PACU, vss 

## 2017-11-23 NOTE — Progress Notes (Signed)
Pt reported he passed a good amount of flatus in the restroom.  No problems noted in the recovery room. maw

## 2017-11-23 NOTE — Patient Instructions (Signed)
YOU HAD AN ENDOSCOPIC PROCEDURE TODAY AT [facility]:   Refer to the procedure report that was given to you for any specific questions about what was found during the examination.  If the procedure report does not answer your questions, please call your gastroenterologist to clarify.  If you requested that your care partner not be given the details of your procedure findings, then the procedure report has been included in a sealed envelope for you to review at your convenience later.  YOU SHOULD EXPECT: Some feelings of bloating in the abdomen. Passage of more gas than usual.  Walking can help get rid of the air that was put into your GI tract during the procedure and reduce the bloating. If you had a lower endoscopy (such as a colonoscopy or flexible sigmoidoscopy) you may notice spotting of blood in your stool or on the toilet paper. If you underwent a bowel prep for your procedure, you may not have a normal bowel movement for a few days.  Please Note:  You might notice some irritation and congestion in your nose or some drainage.  This is from the oxygen used during your procedure.  There is no need for concern and it should clear up in a day or so.  SYMPTOMS TO REPORT IMMEDIATELY:   Following lower endoscopy (colonoscopy or flexible sigmoidoscopy):  Excessive amounts of blood in the stool  Significant tenderness or worsening of abdominal pains  Swelling of the abdomen that is new, acute  Fever of 100F or higher  For urgent or emergent issues, a gastroenterologist can be reached at any hour by calling [phone].   DIET:  We do recommend a small meal at first, but then you may proceed to your regular diet.  Drink plenty of fluids but you should avoid alcoholic beverages for 24 hours.  ACTIVITY:  You should plan to take it easy for the rest of today and you should NOT DRIVE or use heavy machinery until tomorrow (because of the sedation medicines used during the test).     FOLLOW UP: Our staff will call the number listed on your records the next business day following your procedure to check on you and address any questions or concerns that you may have regarding the information given to you following your procedure. If we do not reach you, we will leave a message.  However, if you are feeling well and you are not experiencing any problems, there is no need to return our call.  We will assume that you have returned to your regular daily activities without incident.  If any biopsies were taken you will be contacted by phone or by letter within the next 1-3 weeks.  Please call us at [phone] if you have not heard about the biopsies in 3 weeks.    SIGNATURES/CONFIDENTIALITY: You and/or your care partner have signed paperwork which will be entered into your electronic medical record.  These signatures attest to the fact that that the information above on your After Visit Summary has been reviewed and is understood.  Full responsibility of the confidentiality of this discharge information lies with you and/or your care-partner.    Handout was given to your care partner on polyps. You may resume your current medications today. Await biopsy results. Repeat colonoscopy in 5 years with a more extensive prep. Please call if any questions or concerns.

## 2017-11-23 NOTE — Progress Notes (Signed)
Called to room to assist during endoscopic procedure.  Patient ID and intended procedure confirmed with present staff. Received instructions for my participation in the procedure from the performing physician.  

## 2017-11-23 NOTE — Op Note (Signed)
[facility] Patient Name: Craig Farrell Procedure Date: 11/23/2017 1:59 PM MRN: [id number] Endoscopist: Ladene Artist , MD Age: 67 Referring MD:  Date of Birth: 11-02-1950 Gender: Male Account #: [id number] Procedure:                Colonoscopy Indications:              Surveillance: Personal history of adenomatous                            polyps on last colonoscopy 5 years ago Medicines:                Monitored Anesthesia Care Procedure:                Pre-Anesthesia Assessment:                           - Prior to the procedure, a History and Physical                            was performed, and patient medications and                            allergies were reviewed. The patient's tolerance of                            previous anesthesia was also reviewed. The risks                            and benefits of the procedure and the sedation                            options and risks were discussed with the patient.                            All questions were answered, and informed consent                            was obtained. Prior Anticoagulants: The patient has                            taken no previous anticoagulant or antiplatelet                            agents. ASA Grade Assessment: II - A patient with                            mild systemic disease. After reviewing the risks                            and benefits, the patient was deemed in                            satisfactory condition to undergo the procedure.  After obtaining informed consent, the colonoscope                            was passed under direct vision. Throughout the                            procedure, the patient's blood pressure, pulse, and                            oxygen saturations were monitored continuously. The                            Colonoscope was introduced through the anus and                            advanced to the the cecum,  identified by                            appendiceal orifice and ileocecal valve. The                            ileocecal valve, appendiceal orifice, and rectum                            were photographed. The quality of the bowel                            preparation was adequate after extensive lavage and                            suctioning. The colonoscopy was performed without                            difficulty. The patient tolerated the procedure                            well. Scope In: 2:12:27 PM Scope Out: 2:26:40 PM Scope Withdrawal Time: 0 hours 11 minutes 17 seconds  Total Procedure Duration: 0 hours 14 minutes 13 seconds  Findings:                 The perianal and digital rectal examinations were                            normal.                           A 6 mm polyp was found in the cecum. The polyp was                            sessile. The polyp was removed with a cold snare.                            Resection and retrieval were complete.  The exam was otherwise without abnormality on                            direct and retroflexion views. Complications:            No immediate complications. Estimated blood loss:                            None. Estimated Blood Loss:     Estimated blood loss: none. Impression:               - One 6 mm polyp in the cecum, removed with a cold                            snare. Resected and retrieved.                           - The examination was otherwise normal on direct                            and retroflexion views. Recommendation:           - Repeat colonoscopy in 5 years for surveillance                            with a more extensive bowel prep.                           - Patient has a contact number available for                            emergencies. The signs and symptoms of potential                            delayed complications were discussed with the                             patient. Return to normal activities tomorrow.                            Written discharge instructions were provided to the                            patient.                           - Resume previous diet.                           - Continue present medications.                           - Await pathology results. Ladene Artist, MD 11/23/2017 2:30:15 PM This report has been signed electronically.

## 2017-11-24 ENCOUNTER — Telehealth: Payer: Self-pay

## 2017-11-24 NOTE — Telephone Encounter (Signed)
  Follow up Call-  Call back number 11/23/2017  Post procedure Call Back phone  # [phone]  Permission to leave phone message Yes  Some recent data might be hidden     Left message

## 2017-11-24 NOTE — Telephone Encounter (Signed)
Patient calling back states he is doing fine after procedure.

## 2017-11-24 NOTE — Telephone Encounter (Signed)
Left message on answering machine. 

## 2017-12-01 ENCOUNTER — Encounter: Payer: Self-pay | Admitting: Gastroenterology

## 2017-12-01 ENCOUNTER — Encounter: Payer: Self-pay | Admitting: Sports Medicine

## 2017-12-02 NOTE — Telephone Encounter (Signed)
Will you and Lea look into this? Thanks

## 2017-12-04 ENCOUNTER — Ambulatory Visit (INDEPENDENT_AMBULATORY_CARE_PROVIDER_SITE_OTHER): Payer: Medicare Other | Admitting: Sports Medicine

## 2017-12-04 ENCOUNTER — Ambulatory Visit: Payer: Self-pay

## 2017-12-04 ENCOUNTER — Encounter: Payer: Self-pay | Admitting: Sports Medicine

## 2017-12-04 VITALS — BP 134/88 | HR 67 | Ht 71.0 in | Wt 279.0 lb

## 2017-12-04 DIAGNOSIS — M25551 Pain in right hip: Secondary | ICD-10-CM | POA: Diagnosis not present

## 2017-12-04 DIAGNOSIS — R002 Palpitations: Secondary | ICD-10-CM | POA: Diagnosis not present

## 2017-12-04 DIAGNOSIS — M1611 Unilateral primary osteoarthritis, right hip: Secondary | ICD-10-CM | POA: Diagnosis not present

## 2017-12-04 DIAGNOSIS — M10072 Idiopathic gout, left ankle and foot: Secondary | ICD-10-CM | POA: Diagnosis not present

## 2017-12-04 DIAGNOSIS — M1712 Unilateral primary osteoarthritis, left knee: Secondary | ICD-10-CM

## 2017-12-04 NOTE — Patient Instructions (Signed)

## 2017-12-04 NOTE — Procedures (Signed)
PROCEDURE NOTE:  Ultrasound Guided: Injection: Right hip Images were obtained and interpreted by myself, Teresa Coombs, DO  Images have been saved and stored to PACS system. Images obtained on: GE S7 Ultrasound machine    ULTRASOUND FINDINGS:  Small effusion, moderate degenerative change  DESCRIPTION OF PROCEDURE:  The patient's clinical condition is marked by substantial pain and/or significant functional disability. Other conservative therapy has not provided relief, is contraindicated, or not appropriate. There is a reasonable likelihood that injection will significantly improve the patient's pain and/or functional impairment.   After discussing the risks, benefits and expected outcomes of the injection and all questions were reviewed and answered, the patient wished to undergo the above named procedure.  Verbal consent was obtained.  The ultrasound was used to identify the target structure and adjacent neurovascular structures. The skin was then prepped in sterile fashion and the target structure was injected under direct visualization using sterile technique as below:  PREP: Alcohol and Ethel Chloride APPROACH: direct, single injection, 22g 3.5 in. INJECTATE: 5 cc 1% lidocaine, 1 cc 0.5% Marcaine and 1 cc 40mg /mL DepoMedrol ASPIRATE: None DRESSING: Band-Aid  Post procedural instructions including recommending icing and warning signs for infection were reviewed.    This procedure was well tolerated and there were no complications.   IMPRESSION: Succesful Ultrasound Guided: Injection

## 2017-12-04 NOTE — Progress Notes (Signed)
Craig Farrell. Craig Farrell, Craig Farrell at [employer]  [name] - 67 y.o. male MRN [id number]  Date of birth: 1950/10/26  Visit Date: 12/04/2017  PCP: Marin Olp, MD   Referred by: Marin Olp, MD  Scribe for today's visit: Craig Farrell, CMA     SUBJECTIVE:  Craig Farrell "[name]" is here for No chief complaint on file.  His R hip pain symptoms INITIALLY: Began several years ago but has gotten worse over the past month. He fell at [facility] in late 2014, that when pain initially began.  Described as mild at rest but severe when trying to bend at the waist. Pain when bending is described as stabbing, like ab abscess tooth. Pain at rest is a constant dull ache.  Worsened with bending, lying on the R side. He has some pain when going from sit to stand and when squatting.  Improved with rest. Additional associated symptoms include: Pain is mostly lateral and radiates to the lower back and top of the buttock.     At this time symptoms are worsening compared to onset He has been taking Tylenol with minimal relief. He will take Celebrex occasionally but it causes his legs to ache at night when he takes it. He has tried icing the knee and using heat with minimal relief.   He reports that his L knee has started to stiffen up on him again.   ROS Reports night time disturbances. Denies fevers, chills, or night sweats. Denies unexplained weight loss. Denies personal history of cancer. Denies changes in bowel or bladder habits. Denies recent unreported falls. Denies new or worsening dyspnea or wheezing. Denies headaches or dizziness.  Reports numbness, tingling or weakness  In the extremities.  Denies dizziness or presyncopal episodes Reports lower extremity edema    HISTORY & PERTINENT PRIOR DATA:  Prior History reviewed and updated per electronic medical record.  Significant/pertinent history, findings, studies include:  reports  that he has never smoked. He has never used smokeless tobacco. Recent Labs    10/21/17 1027  LABURIC 8.3*   08/18/2017 - orthovisc is covered by Medicare 80/20 after deductible has been met, no copay, OOP does not apply. Craig Farrell is part F supplement and insurance responsibility is 100%.  09/10/17 - Zilretta: Medicare will cover 80%, deductible has been met. [employer] will cover medicare copay and coinsurance. - BSC No problems updated.  OBJECTIVE:  VS:  HT:'5\' 11"'$  (180.3 cm)   WT:279 lb (126.6 kg)  BMI:38.93    BP:134/88  HR:67bpm  TEMP: ( )  RESP:95 %   PHYSICAL EXAM: Constitutional: WDWN, Non-toxic appearing. Psychiatric: Alert & appropriately interactive.  Not depressed or anxious appearing. Respiratory: No increased work of breathing.  Trachea Midline Eyes: Pupils are equal.  EOM intact without nystagmus.  No scleral icterus  Vascular Exam: warm to touch no edema  lower extremity neuro exam: unremarkable normal strength normal sensation  MSK Exam: Right hip has some pain with internal and external rotation of the hip.  Positive Stinchfield test.  Pain with FADIR testing.  ASSESSMENT & PLAN:   1. Right hip pain   2. Primary osteoarthritis of right hip   3. Palpitations   4. Acute idiopathic gout of left foot   5. Primary osteoarthritis of left knee     PLAN: Right hip injected today.  Low-dose steroid given previous adverse effects of steroids and possible related triamcinolone response to the palpitations with this seems to  be of low likelihood.  Has tolerated Depo-Medrol quite well in the past.  His left knee pain is significantly improved in the past we will plan to recheck with him in 6 weeks.  Follow-up: Return in about 6 weeks (around 01/15/2018).     Please see additional documentation for Objective, Assessment and Plan sections. Pertinent additional documentation may be included in corresponding procedure notes, imaging studies, problem based documentation and  patient instructions. Please see these sections of the encounter for additional information regarding this visit.  CMA/ATC served as Education administrator during this visit. History, Physical, and Plan performed by medical provider. Documentation and orders reviewed and attested to.      Gerda Diss, [facility] Physician

## 2017-12-20 ENCOUNTER — Encounter: Payer: Self-pay | Admitting: Sports Medicine

## 2017-12-22 ENCOUNTER — Telehealth: Payer: Self-pay

## 2017-12-22 NOTE — Telephone Encounter (Signed)
12/16/2017- Monovisc - Medicare covers 80%, USAA covers 20% coinsurance.

## 2017-12-22 NOTE — Telephone Encounter (Signed)
Spoke with patient and advised regarding Monovisc coverage. He would like for Dr. Paulla Fore to know that he didn't get very much relief after his last hip injection. He says that it is a little better but he is still having pain and it definitely didn't work as well as the injection that he had for his other hip.

## 2017-12-22 NOTE — Telephone Encounter (Signed)
L hip injected with 2 CC Depo 40, R hip injected with 1 CC Depo 40.

## 2017-12-24 DIAGNOSIS — R3911 Hesitancy of micturition: Secondary | ICD-10-CM | POA: Diagnosis not present

## 2017-12-24 DIAGNOSIS — N5201 Erectile dysfunction due to arterial insufficiency: Secondary | ICD-10-CM | POA: Diagnosis not present

## 2017-12-24 DIAGNOSIS — R350 Frequency of micturition: Secondary | ICD-10-CM | POA: Diagnosis not present

## 2017-12-24 DIAGNOSIS — N401 Enlarged prostate with lower urinary tract symptoms: Secondary | ICD-10-CM | POA: Diagnosis not present

## 2017-12-24 DIAGNOSIS — R972 Elevated prostate specific antigen [PSA]: Secondary | ICD-10-CM | POA: Diagnosis not present

## 2018-01-18 ENCOUNTER — Encounter: Payer: Self-pay | Admitting: Family Medicine

## 2018-01-19 ENCOUNTER — Ambulatory Visit (INDEPENDENT_AMBULATORY_CARE_PROVIDER_SITE_OTHER): Payer: Medicare Other | Admitting: Family Medicine

## 2018-01-19 ENCOUNTER — Encounter: Payer: Self-pay | Admitting: Family Medicine

## 2018-01-19 VITALS — BP 128/60 | HR 69 | Temp 98.1°F | Ht 71.0 in | Wt 279.6 lb

## 2018-01-19 DIAGNOSIS — R972 Elevated prostate specific antigen [PSA]: Secondary | ICD-10-CM | POA: Diagnosis not present

## 2018-01-19 DIAGNOSIS — E785 Hyperlipidemia, unspecified: Secondary | ICD-10-CM

## 2018-01-19 DIAGNOSIS — I10 Essential (primary) hypertension: Secondary | ICD-10-CM

## 2018-01-19 DIAGNOSIS — E039 Hypothyroidism, unspecified: Secondary | ICD-10-CM | POA: Diagnosis not present

## 2018-01-19 MED ORDER — AZELASTINE HCL 0.15 % NA SOLN: NASAL | 3 refills | Status: DC

## 2018-01-19 NOTE — Assessment & Plan Note (Signed)
S: saw Dr. Gloriann Loan of urology- was told fine for now - 4 month follow up and repeat PSA. Rectal exam was reassuring to him.  Lab Results  Component Value Date   PSA 2.92 10/21/2017   PSA 2.49 06/23/2017   PSA 1.03 10/26/2014  A/P: continue urology follow up.

## 2018-01-19 NOTE — Progress Notes (Signed)
Subjective:  Craig Farrell is a 67 y.o. year old very pleasant male patient who presents for/with See problem oriented charting ROS- right knee pain noted. No chest pain or shortness of breath reported. No increased edema.  Some weak stream.   Past Medical History-  Patient Active Problem List   Diagnosis Date Noted  . Morbid obesity ([place]) 05/07/2017    Priority: High  . Elevated PSA 01/19/2018    Priority: Medium  . Hyperlipidemia 06/23/2017    Priority: Medium  . BPH associated with nocturia 06/16/2017    Priority: Medium  . Primary osteoarthritis of left knee 05/07/2017    Priority: Medium  . Sleep apnea treated with nocturnal BiPAP     Priority: Medium  . Arthritis     Priority: Medium  . Hypertension     Priority: Medium  . Hypothyroidism     Priority: Medium  . History of adenomatous polyp of colon     Priority: Medium  . Tinnitus 05/07/2017    Priority: Low  . Allergic rhinitis     Priority: Low  . Yellow jacket sting allergy     Priority: Low  . Erectile dysfunction     Priority: Low  . Episodic cluster headache, not intractable 10/14/2017  . Migraine without aura and without status migrainosus, not intractable 10/14/2017  . Primary osteoarthritis of left hip 07/28/2017  . Hypersomnia with sleep apnea 07/15/2017    Medications- reviewed and updated Current Outpatient Medications  Medication Sig Dispense Refill  . acetaminophen (TYLENOL) 500 MG tablet Take by mouth as needed.    . ASPIRIN 81 PO Take 81 mg by mouth daily.     . Azelastine HCl 0.15 % SOLN SPRAY 2 SPRAY BY INTRANASAL ROUTE EVERY DAY IN EACH NOSTRIL 30 mL 3  . celecoxib (CELEBREX) 200 MG capsule Take 200 mg by mouth daily.    . Cholecalciferol (GNP VITAMIN D MAXIMUM STRENGTH) 2000 units TABS Take 2,000 Units by mouth 2 (two) times daily.    Marland Kitchen lisinopril (PRINIVIL,ZESTRIL) 10 MG tablet TAKE 1 TABLET BY MOUTH EVERY DAY 90 tablet 1  . tadalafil (CIALIS) 20 MG tablet Take 1 tablet (20 mg total) by  mouth as needed. 10 tablet 5   Objective: BP 128/60 (BP Location: Left Arm, Patient Position: Sitting, Cuff Size: Normal)   Pulse 69   Temp 98.1 F (36.7 C) (Oral)   Ht 5\' 11"  (1.803 m)   Wt 279 lb 9.6 oz (126.8 kg)   SpO2 94%   BMI 39.00 kg/m  Gen: NAD, resting comfortably CV: RRR no murmurs rubs or gallops Lungs: CTAB no crackles, wheeze, rhonchi Abdomen: soft/nontender/nondistended/normal bowel sounds.  obese Ext: no edema Skin: warm, dry  Assessment/Plan:  Other notes: 1. needs to get back into see Dr. Paulla Fore to consider monovisc or similar.   Elevated PSA S: saw Dr. Gloriann Loan of urology- was told fine for now - 4 month follow up and repeat PSA. Rectal exam was reassuring to him.  Lab Results  Component Value Date   PSA 2.92 10/21/2017   PSA 2.49 06/23/2017   PSA 1.03 10/26/2014  A/P: continue urology follow up.   Hyperlipidemia S:  weight down 12 lbs since January. He wanted to remain off statin and work on weight loss. Cardiologist recommended rosuvastatin 5mg  which apparently was very expensive (unclear why as generic) A/P: recheck lipids 06/23/18 or later and continue weight loss efforts. He is also going to check with insurance to see if other dosages of rosuvastatin  are more affordable or if perhaps pharmacy submitted incorrectly   Hypothyroidism S: On thyroid medication-has been of synthroid for about 6 months  Lab Results  Component Value Date   TSH 2.51 10/21/2017  A/P: update tsh since he is still off synthroid and having trouble losing weight  Hypertension S: controlled on lisinopril 10mg , propranolol 60mg  LA BP Readings from Last 3 Encounters:  01/19/18 128/60  12/04/17 134/88  11/23/17 131/69  A/P: We discussed blood pressure goal of <140/90. Continue current meds  06/23/18 or later for CPE- come fasting  Lab/Order associations: Hypothyroidism, unspecified type - Plan: TSH  Elevated PSA  Essential hypertension  Hyperlipidemia, unspecified  hyperlipidemia type  Meds ordered this encounter  Medications  . Azelastine HCl 0.15 % SOLN    Sig: SPRAY 2 SPRAY BY INTRANASAL ROUTE EVERY DAY IN EACH NOSTRIL    Dispense:  30 mL    Refill:  3    Return precautions advised.  [name], MD

## 2018-01-19 NOTE — Assessment & Plan Note (Signed)
S:  weight down 12 lbs since January. He wanted to remain off statin and work on weight loss. Cardiologist recommended rosuvastatin 5mg  which apparently was very expensive (unclear why as generic) A/P: recheck lipids 06/23/18 or later and continue weight loss efforts. He is also going to check with insurance to see if other dosages of rosuvastatin are more affordable or if perhaps pharmacy submitted incorrectly

## 2018-01-19 NOTE — Patient Instructions (Addendum)
I would also like for you to sign up for an annual wellness visit with one of our nurses, Cassie or Manuela Schwartz, who both specialize in the annual wellness visit. This is a free benefit under medicare that may help [place] find additional ways to help you. Some highlights are reviewing medications, lifestyle, and doing a dementia screen.  Please check with your pharmacy to see if they have the shingrix vaccine. If they do- please get this immunization and update [place] by phone call or mychart with dates you receive the vaccine.  Schedule a physical for 06/23/18 or later. I want you to set a goal of at least 10 more lbs off by then.   Please stop by lab before you go- just to check on thyroid  I would double check with insurance about the rosuvastatin- this should not be that expensive- perhaps the 10mg  or 20mg  is cheaper? You could ask your insurance as we could use 10mg  every other day and get similar benefit

## 2018-01-19 NOTE — Assessment & Plan Note (Signed)
S: controlled on lisinopril 10mg , propranolol 60mg  LA BP Readings from Last 3 Encounters:  01/19/18 128/60  12/04/17 134/88  11/23/17 131/69  A/P: We discussed blood pressure goal of <140/90. Continue current meds

## 2018-01-19 NOTE — Assessment & Plan Note (Signed)
S: On thyroid medication-has been of synthroid for about 6 months  Lab Results  Component Value Date   TSH 2.51 10/21/2017  A/P: update tsh since he is still off synthroid and having trouble losing weight

## 2018-01-20 LAB — TSH: TSH: 1.53 u[IU]/mL (ref 0.35–4.50)

## 2018-01-25 ENCOUNTER — Encounter: Payer: Self-pay | Admitting: Family Medicine

## 2018-01-26 ENCOUNTER — Encounter: Payer: Self-pay | Admitting: Sports Medicine

## 2018-01-26 ENCOUNTER — Ambulatory Visit: Payer: Self-pay

## 2018-01-26 ENCOUNTER — Ambulatory Visit (INDEPENDENT_AMBULATORY_CARE_PROVIDER_SITE_OTHER): Payer: Medicare Other | Admitting: Sports Medicine

## 2018-01-26 VITALS — BP 142/80 | HR 64 | Ht 71.0 in | Wt 282.6 lb

## 2018-01-26 DIAGNOSIS — M25559 Pain in unspecified hip: Secondary | ICD-10-CM | POA: Diagnosis not present

## 2018-01-26 DIAGNOSIS — M1712 Unilateral primary osteoarthritis, left knee: Secondary | ICD-10-CM | POA: Diagnosis not present

## 2018-01-26 NOTE — Telephone Encounter (Signed)
Patient requests an update on earlier message.

## 2018-01-26 NOTE — Progress Notes (Signed)
Craig Farrell. [name], [facility]  Craig Farrell - 67 y.o. male MRN [id number]  Date of birth: 04/19/1951  Visit Date: 01/26/2018  PCP: Marin Olp, MD   Referred by: Marin Olp, MD  Scribe(s) for today's visit: Josepha Pigg, CMA  SUBJECTIVE:  Craig Farrell "[name]" is here for Follow-up (L knee pain)   09/11/2017: Compared to the last office visit on 09/08/17, his previously described L knee pain symptoms are worsening he is now having pain from left knee to outside of calf.  Current symptoms are moderate & are radiating to outside of calf He has been taking tylenol and celebrex with little relief.   10/21/2017: Compared to the last office visit, his previously described symptoms are improving. He still has occasional twinge of pain but less severe and less often.  Current symptoms are mild & are nonradiating He has been taking Celebrex prn and Tylenol prn with some relief.  He had Zilretta injection 09/11/17 and was then seen at the ED for palpitations and told it was related to Zilretta injection. He continues to have HA, light-headedness, and about 6 episodes of palpitations daily. He does not recall having any issues with this prior to Zilretta injection.   01/26/2018: Compared to the last office visit, his previously described symptoms are worsening, pain has flared up since returning to work. He has noticed increased stiffness in the knee. He reports some swelling around the knee. He also reports that his foot pain has started to flare-up and he has started having pain on the lateral aspect of the L leg.  Current symptoms are mild & are nonradiating He has been taking Tylenol prn and wearing compression sleeve with some relief. When its warm, the compression irritates his skin.    REVIEW OF SYSTEMS: Reports night time disturbances. Denies fevers, chills, or night sweats. Denies unexplained weight  loss. Denies personal history of cancer. Denies changes in bowel or bladder habits. Denies recent unreported falls. Denies new or worsening dyspnea or wheezing. Denies headaches or dizziness.  Reports weakness  L knee.  Denies dizziness or presyncopal episodes Reports lower extremity edema    HISTORY:  Prior history reviewed and updated per electronic medical record.  Social History   Occupational History  . Not on file  Tobacco Use  . Smoking status: Never Smoker  . Smokeless tobacco: Never Used  Substance and Sexual Activity  . Alcohol use: No  . Drug use: No  . Sexual activity: Not on file   Social History   Social History Narrative   Married 1973. 1 child 37 in [place]- will be in [place] still. 65 month old grandbaby 04/2017.       3 years of college. 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       Two cups caffeine daily.      Right-handed.     DATA OBTAINED & REVIEWED:   Recent Labs    10/21/17 1027  LABURIC 8.3*   .   . 08/19/2017 x-rays of the left knee and hip obtained significant medial compartment degenerative changes of the knee.  Minimal changes of the hip  OBJECTIVE:  VS:  HT:5\' 11"  (180.3 cm)   WT:282 lb 9.6 oz (128.2 kg)  BMI:39.43    BP:(!) 142/80  HR:64bpm  TEMP: ( )  RESP:95 %  PHYSICAL EXAM: CONSTITUTIONAL: Well-developed, Well-nourished and In no acute distress PSYCHIATRIC: Alert & appropriately interactive. and Not depressed or anxious appearing. RESPIRATORY: No increased work of breathing and Trachea Midline EYES: Pupils are equal., EOM intact without nystagmus. and No scleral icterus.   Left knee: Well aligned.  Does have generalized osteophytic bossing.  Moderate effusion. Moderate synovitis.  ASSESSMENT   1. Primary osteoarthritis of left knee   2. Greater trochanteric pain syndrome     PLAN:  Pertinent additional  documentation may be included in corresponding procedure notes, imaging studies, problem based documentation and patient instructions.  Procedures:  . US Guided Injection per procedure note  Medications:  No orders of the defined types were placed in this encounter.  Discussion/Instructions: Marland Kitchen Monovisc injection provided today.  Continue with compression sleeve. . Continue previously prescribed home exercise program.  . Discussed red flag symptoms that warrant earlier emergent evaluation and patient voices understanding. . Activity modifications and the importance of avoiding exacerbating activities (limiting pain to no more than a 4 / 10 during or following activity) recommended and discussed.  Follow-up:  . Return in about 8 weeks (around 03/23/2018).  . If any lack of improvement consider: . Referral for total knee arthroplasty but can continue with Visco supplementation/standard corticosteroid injections as he will not tolerate Zilretta     CMA/ATC served as scribe during this visit. History, Physical, and Plan performed by medical provider. Documentation and orders reviewed and attested to.      Gerda Diss, [facility] Physician

## 2018-01-26 NOTE — Procedures (Signed)
PROCEDURE NOTE:  Ultrasound Guided: Aspiration and Injection: Left knee Images were obtained and interpreted by myself, Teresa Coombs, DO  Images have been saved and stored to PACS system. Images obtained on: GE S7 Ultrasound machine    ULTRASOUND FINDINGS:  Moderate effusion Moderate synovitis  DESCRIPTION OF PROCEDURE:  The patient's clinical condition is marked by substantial pain and/or significant functional disability. Other conservative therapy has not provided relief, is contraindicated, or not appropriate. There is a reasonable likelihood that injection will significantly improve the patient's pain and/or functional impairment.   After discussing the risks, benefits and expected outcomes of the injection and all questions were reviewed and answered, the patient wished to undergo the above named procedure.  Verbal consent was obtained.  The ultrasound was used to identify the target structure and adjacent neurovascular structures. The skin was then prepped in sterile fashion and the target structure was injected under direct visualization using sterile technique as below:  Single injection performed as below: PREP: Alcohol, Ethel Chloride and 5 cc 1% lidocaine on 25g 1.5 in. needle APPROACH:superiolateral, stopcock technique, 18g 1.5 in. INJECTATE: 4 cc Monovisc ASPIRATE: 30cc slightly orange tinged fluid DRESSING: Band-Aid and 6-inch Ace Wrap  Post procedural instructions including recommending icing and warning signs for infection were reviewed.    This procedure was well tolerated and there were no complications.   IMPRESSION: Succesful Ultrasound Guided: Aspiration and Injection

## 2018-01-26 NOTE — Patient Instructions (Addendum)

## 2018-01-27 NOTE — Telephone Encounter (Signed)
Pt seen in office 01/26/18 - he will f/u at a later time regarding hip pain.

## 2018-02-05 ENCOUNTER — Encounter: Payer: Self-pay | Admitting: Sports Medicine

## 2018-02-08 NOTE — Telephone Encounter (Signed)
I haven't "checked" as in verifying his medicare, but he doesn't require a prior auth or pre-cert. Medicare is always covered for our PT.

## 2018-02-09 ENCOUNTER — Ambulatory Visit: Payer: Medicare Other | Admitting: Physical Therapy

## 2018-03-01 ENCOUNTER — Ambulatory Visit (INDEPENDENT_AMBULATORY_CARE_PROVIDER_SITE_OTHER): Payer: Medicare Other | Admitting: Physical Therapy

## 2018-03-01 DIAGNOSIS — M25562 Pain in left knee: Secondary | ICD-10-CM

## 2018-03-01 DIAGNOSIS — G8929 Other chronic pain: Secondary | ICD-10-CM

## 2018-03-01 DIAGNOSIS — R2689 Other abnormalities of gait and mobility: Secondary | ICD-10-CM

## 2018-03-01 NOTE — Patient Instructions (Signed)
Access Code: [email or web address]  URL: [email or web address]  Date: 03/01/2018  Prepared by: Lyndee Hensen   Exercises  Supine Quadricep Sets - 10 reps - 2 sets - 2x daily  Straight Leg Raise - 10 reps - 2 sets - 2x daily  Seated Long Arc Quad - 10 reps - 2 sets - 2x daily  Supine Heel Slides - 10 reps - 2 sets - 2x daily

## 2018-03-02 ENCOUNTER — Encounter: Payer: Self-pay | Admitting: Physical Therapy

## 2018-03-02 NOTE — Therapy (Signed)
[facility] [address] [place], [place], [address] Phone: [phone]   Fax:  [phone]  Physical Therapy Evaluation  Patient Details  Name: Craig Farrell MRN: [id number] Date of Birth: [birth year] Referring Provider: Teresa Coombs   Encounter Date: 03/01/2018  PT End of Session - 03/01/18 1628    Visit Number  1    Number of Visits  12    Date for PT Re-Evaluation  04/12/18    Authorization Type  Medicare    PT Start Time  0408    PT Stop Time  0448    PT Time Calculation (min)  40 min    Activity Tolerance  Patient tolerated treatment well    Behavior During Therapy  [facility] for tasks assessed/performed       Past Medical History:  Diagnosis Date  . Allergic rhinitis    astelin  . Arthritis    Hips and shoulders. celebrex 200mg  BID. plans to cut down with medicare costs  . Blood in stool    augmentin  . Erectile dysfunction    cialis 20mg   . Hx of diverticulitis of colon   . Hypertension    lisinopril 10mg   . Hypothyroidism    synthroid 25 mcg  . OSA on CPAP   . Sleep apnea    ASV machine  . Tubular adenoma of colon    2014- was told q3 years- needs update. likely adenoma  . UTI (urinary tract infection)    x1- in hospital.   . Yellow jacket sting allergy    epi pens    Past Surgical History:  Procedure Laterality Date  . HAND SURGERY Right   . left foot surgery     fracture related  . left knee surgery     1980    There were no vitals filed for this visit.   Subjective Assessment - 03/01/18 1615    Subjective  Pt states pain in L knee, since 1985, when he had knee surgery. He has had increased pain in last year or so, due to job change, being up on his feet a lot. He Retired , but is now back to work  at [employer], standing, walking, lifting.  He has seen ortho MD, it was recommended that he have knee replacement, but pt is unable to do that at this time, with job, etc. He has had a few inejections, one  he had signficiant adverse reaction to, and other he did have mild decreased pain from. He continues to have pain with increased standing, walking activities. He is currently not doing HEP for LE strength.     Limitations  Lifting;Standing;Walking;House hold activities    Diagnostic tests  X-Ray, severe degenerative changes at medial compartment, with tibial sublux in comparison to femur laterally    Patient Stated Goals  decreased pain,  learn HEP     Currently in Pain?  Yes    Pain Score  1     Pain Location  Knee    Pain Orientation  Left    Pain Descriptors / Indicators  Aching;Sharp    Pain Type  Chronic pain    Pain Onset  More than a month ago    Pain Frequency  Intermittent    Aggravating Factors   Standing, walking,     Pain Relieving Factors  rest, celebrex.          [facility] PT Assessment - 03/01/18 1629      Assessment  Medical Diagnosis  L knee Pain/OA    Referring Provider  Teresa Coombs      Precautions   Precautions  None      Balance Screen   Has the patient fallen in the past 6 months  No      Prior Function   Level of Independence  Independent      Cognition   Overall Cognitive Status  Within Functional Limits for tasks assessed      Posture/Postural Control   Posture Comments  Standing: Valgus position of L knee.       ROM / Strength   AROM / PROM / Strength  AROM;Strength      AROM   Overall AROM Comments  L Knee Flex: 115, Ext: -5;  Hip: WFL;       Strength   Overall Strength Comments  L Knee: WFL; Hip: 4/5       Palpation   Palpation comment  Mild stiffness at end range for flexion; Mild hip tightness bil;  Pain at medial joint line on L knee, and medial patella border;       Ambulation/Gait   Gait Comments  mild Trendellenberg                Objective measurements completed on examination: See above findings.      [facility] Adult PT Treatment/Exercise - 03/02/18 0001      Exercises   Exercises  Knee/Hip      Knee/Hip  Exercises: Seated   Long Arc Quad  20 reps      Knee/Hip Exercises: Supine   Quad Sets  20 reps    Heel Slides  20 reps    Straight Leg Raises  20 reps             PT Education - 03/01/18 1627    Education Details  HEP, PT POC    Person(s) Educated  Patient    Methods  Explanation    Comprehension  Verbalized understanding;Need further instruction          PT Long Term Goals - 03/02/18 1010      PT LONG TERM GOAL #1   Title  Pt to be independent with HEP for LE strength and mobility    Time  6    Period  Weeks    Status  New    Target Date  04/13/18      PT LONG TERM GOAL #2   Title  Pt to report decreased pain in L knee, to 0-3/10 with standing activity     Time  6    Period  Weeks    Status  New    Target Date  04/13/18      PT LONG TERM GOAL #3   Title  --             Plan - 03/02/18 1013    Clinical Impression Statement  Pt presents with primary complaint of increased pain in L knee, consistent with significant OA. Pt with good preservation of strength and ROM. He is lacking mild ROM at end ranges for flexion and extension. He has significant pain with activity, increased with standing, walking, and weight bearing activities, and work duties. He has lack of effective HEP for his Dx, and will benefit from education on this. Pt states that he may be changing jobs and may not be able to attend PT as often as recommended. Pt to benefit from skilled PT to improve strength, gait  mechanics, HEP, and for pain relief.     Clinical Presentation  Stable    Clinical Decision Making  Low    Rehab Potential  Fair    Clinical Impairments Affecting Rehab Potential  Severe OA, may be unable to attend PT due to work schedule.     PT Frequency  2x / week    PT Duration  6 weeks    PT Treatment/Interventions  ADLs/Self Care Home Management;Electrical Stimulation;Iontophoresis 4mg /ml Dexamethasone;Moist Heat;Therapeutic activities;Functional mobility training;Stair  training;Gait training;Therapeutic exercise;Balance training;Neuromuscular re-education;Patient/family education;Orthotic Fit/Training;Dry needling;Passive range of motion;Manual techniques;Taping    PT Next Visit Plan  Progress HEP     Consulted and Agree with Plan of Care  Patient       Patient will benefit from skilled therapeutic intervention in order to improve the following deficits and impairments:  Abnormal gait, Decreased endurance, Hypomobility, Decreased activity tolerance, Decreased strength, Pain, Difficulty walking, Decreased range of motion, Improper body mechanics, Impaired flexibility  Visit Diagnosis: Chronic pain of left knee  Other abnormalities of gait and mobility     Problem List Patient Active Problem List   Diagnosis Date Noted  . Greater trochanteric pain syndrome 01/26/2018  . Elevated PSA 01/19/2018  . Episodic cluster headache, not intractable 10/14/2017  . Migraine without aura and without status migrainosus, not intractable 10/14/2017  . Primary osteoarthritis of left hip 07/28/2017  . Hypersomnia with sleep apnea 07/15/2017  . Hyperlipidemia 06/23/2017  . BPH associated with nocturia 06/16/2017  . Primary osteoarthritis of left knee 05/07/2017  . Morbid obesity ([place]) 05/07/2017  . Tinnitus 05/07/2017  . Allergic rhinitis   . Sleep apnea treated with nocturnal BiPAP   . Arthritis   . Yellow jacket sting allergy   . Hypertension   . Hypothyroidism   . Erectile dysfunction   . History of adenomatous polyp of colon     Lyndee Hensen, PT, DPT 10:19 AM  03/02/18    [facility] [place], [place], [address] Phone: [phone]   Fax:  [phone]  Name: Craig Farrell MRN: [id number] Date of Birth: 08/16/1950

## 2018-03-11 ENCOUNTER — Encounter: Payer: Self-pay | Admitting: Physical Therapy

## 2018-03-11 ENCOUNTER — Ambulatory Visit (INDEPENDENT_AMBULATORY_CARE_PROVIDER_SITE_OTHER): Payer: Medicare Other | Admitting: Physical Therapy

## 2018-03-11 DIAGNOSIS — R2689 Other abnormalities of gait and mobility: Secondary | ICD-10-CM

## 2018-03-11 DIAGNOSIS — G8929 Other chronic pain: Secondary | ICD-10-CM

## 2018-03-11 DIAGNOSIS — M25562 Pain in left knee: Secondary | ICD-10-CM | POA: Diagnosis not present

## 2018-03-11 NOTE — Therapy (Addendum)
[facility] [address] [place], [place], [address] Phone: [phone]   Fax:  [phone]  Physical Therapy Treatment  Patient Details  Name: Craig Farrell MRN: [id number] Date of Birth: 1950-10-28 Referring Provider: Teresa Coombs   Encounter Date: 03/11/2018  PT End of Session - 03/11/18 1607    Visit Number  2    Number of Visits  12    Date for PT Re-Evaluation  04/12/18    Authorization Type  Medicare    PT Start Time  1102    PT Stop Time  1646    PT Time Calculation (min)  48 min    Activity Tolerance  Patient tolerated treatment well    Behavior During Therapy  [facility] for tasks assessed/performed       Past Medical History:  Diagnosis Date  . Allergic rhinitis    astelin  . Arthritis    Hips and shoulders. celebrex 268m BID. plans to cut down with medicare costs  . Blood in stool    augmentin  . Erectile dysfunction    cialis 216m . Hx of diverticulitis of colon   . Hypertension    lisinopril 1050m. Hypothyroidism    synthroid 25 mcg  . OSA on CPAP   . Sleep apnea    ASV machine  . Tubular adenoma of colon    2014- was told q3 years- needs update. likely adenoma  . UTI (urinary tract infection)    x1- in hospital.   . Yellow jacket sting allergy    epi pens    Past Surgical History:  Procedure Laterality Date  . HAND SURGERY Right   . left foot surgery     fracture related  . left knee surgery     1980    There were no vitals filed for this visit.  Subjective Assessment - 03/11/18 1605    Subjective  Pt states minimal pain in the last couple days. He has been doing HEP. He was sore after last visit, but improved after 2-3 days.     Limitations  Lifting;Standing;Walking;House hold activities    Diagnostic tests  X-Ray, severe degenerative changes at medial compartment, with tibial sublux in comparison to femur laterally    Patient Stated Goals  decreased pain,  learn HEP     Currently in Pain?   Yes    Pain Score  4     Pain Location  Knee    Pain Orientation  Left    Pain Descriptors / Indicators  Aching;Sharp    Pain Onset  More than a month ago                       [facility] - 03[facility]11[facility] 1612      Exercises   Exercises  Knee/Hip      Knee/Hip Exercises: Stretches   Active Hamstring Stretch  3 reps;30 seconds      Knee/Hip Exercises: Aerobic   Stationary Bike  L1 x 8 min      Knee/Hip Exercises: Standing   Hip Abduction  2 sets;10 reps;Both      Knee/Hip Exercises: Seated   Long Arc Quad  20 reps    Hamstring Curl  20 reps    Hamstring Limitations  GTB      Knee/Hip Exercises: Supine   Quad Sets  20 reps    Heel Slides  --    Straight Leg Raises  20  reps      Manual Therapy   Manual Therapy  Soft tissue mobilization    Soft tissue mobilization  IASTM and DTM with roller, to lateral quad                  PT Long Term Goals - 03/02/18 1010      PT LONG TERM GOAL #1   Title  Pt to be independent with HEP for LE strength and mobility    Time  6    Period  Weeks    Status  New    Target Date  04/13/18      PT LONG TERM GOAL #2   Title  Pt to report decreased pain in L knee, to 0-3/10 with standing activity     Time  6    Period  Weeks    Status  New    Target Date  04/13/18      PT LONG TERM GOAL #3   Title  --            Plan - 03/11/18 1645    Clinical Impression Statement  Pt states decreased pain after session today. Pt with significant tightness and soreness in lateral quad with palpation today. He would benefit from  continued care. HEP reviewed today. Pt unsure if he will be able to attend further sessions, due to getting new job, and work schedule.     Rehab Potential  Fair    Clinical Impairments Affecting Rehab Potential  Severe OA, may be unable to attend PT due to work schedule.     PT Frequency  2x / week    PT Duration  6 weeks    PT Treatment/Interventions  ADLs/Self Care Home  Management;Electrical Stimulation;Iontophoresis 78m/ml Dexamethasone;Moist Heat;Therapeutic activities;Functional mobility training;Stair training;Gait training;Therapeutic exercise;Balance training;Neuromuscular re-education;Patient/family education;Orthotic Fit/Training;Dry needling;Passive range of motion;Manual techniques;Taping    PT Next Visit Plan  Progress HEP     Consulted and Agree with Plan of Care  Patient       Patient will benefit from skilled therapeutic intervention in order to improve the following deficits and impairments:  Abnormal gait, Decreased endurance, Hypomobility, Decreased activity tolerance, Decreased strength, Pain, Difficulty walking, Decreased range of motion, Improper body mechanics, Impaired flexibility  Visit Diagnosis: Chronic pain of left knee  Other abnormalities of gait and mobility     Problem List Patient Active Problem List   Diagnosis Date Noted  . Greater trochanteric pain syndrome 01/26/2018  . Elevated PSA 01/19/2018  . Episodic cluster headache, not intractable 10/14/2017  . Migraine without aura and without status migrainosus, not intractable 10/14/2017  . Primary osteoarthritis of left hip 07/28/2017  . Hypersomnia with sleep apnea 07/15/2017  . Hyperlipidemia 06/23/2017  . BPH associated with nocturia 06/16/2017  . Primary osteoarthritis of left knee 05/07/2017  . Morbid obesity ([place] 05/07/2017  . Tinnitus 05/07/2017  . Allergic rhinitis   . Sleep apnea treated with nocturnal BiPAP   . Arthritis   . Yellow jacket sting allergy   . Hypertension   . Hypothyroidism   . Erectile dysfunction   . History of adenomatous polyp of colon    LLyndee Hensen PT, DPT 4:52 PM  03/11/18    [facility] [place] [address]Phone: [phone]  Fax:  [phone] Name: Craig TelfordMRN: [id number] of Birth: 1Apr 26, 1952    PHYSICAL THERAPY DISCHARGE  SUMMARY  Visits from Start of Care: 2  Plan: Patient agrees to discharge.  Patient goals were partially met. Patient is being discharged due to not returning since the last visit.  ?????      Lyndee Hensen, PT, DPT 2:58 PM  05/18/18

## 2018-04-01 ENCOUNTER — Other Ambulatory Visit: Payer: Self-pay | Admitting: Family Medicine

## 2018-04-01 ENCOUNTER — Encounter: Payer: Self-pay | Admitting: Family Medicine

## 2018-04-02 MED ORDER — AZELASTINE HCL 0.15 % NA SOLN: NASAL | 1 refills | Status: DC

## 2018-04-02 MED ORDER — LISINOPRIL 10 MG PO TABS: 10.0000 mg | ORAL_TABLET | Freq: Every day | ORAL | 1 refills | Status: DC

## 2018-04-02 MED ORDER — TADALAFIL 20 MG PO TABS: 20.0000 mg | ORAL_TABLET | ORAL | 5 refills | Status: DC | PRN

## 2018-04-02 MED ORDER — CELECOXIB 200 MG PO CAPS: 200.0000 mg | ORAL_CAPSULE | Freq: Every day | ORAL | 1 refills | Status: DC

## 2018-04-02 NOTE — Telephone Encounter (Signed)
Spoke to pt, told him all Rx's were sent to pharmacy as requested and I sent a My Chart message as well. Pt verbalized understanding.

## 2018-04-02 NOTE — Telephone Encounter (Signed)
Okay to refill pt's Celebrex? You have not prescribed this.

## 2018-04-05 ENCOUNTER — Encounter: Payer: Self-pay | Admitting: Sports Medicine

## 2018-04-22 DIAGNOSIS — R972 Elevated prostate specific antigen [PSA]: Secondary | ICD-10-CM | POA: Diagnosis not present

## 2018-04-22 LAB — PSA: PSA: 2.09

## 2018-04-29 DIAGNOSIS — R3911 Hesitancy of micturition: Secondary | ICD-10-CM | POA: Diagnosis not present

## 2018-04-29 DIAGNOSIS — N401 Enlarged prostate with lower urinary tract symptoms: Secondary | ICD-10-CM | POA: Diagnosis not present

## 2018-04-29 DIAGNOSIS — N5201 Erectile dysfunction due to arterial insufficiency: Secondary | ICD-10-CM | POA: Diagnosis not present

## 2018-05-03 ENCOUNTER — Encounter: Payer: Self-pay | Admitting: Family Medicine

## 2018-05-31 ENCOUNTER — Encounter: Payer: Self-pay | Admitting: Sports Medicine

## 2018-06-04 ENCOUNTER — Ambulatory Visit: Payer: Self-pay

## 2018-06-04 DIAGNOSIS — J209 Acute bronchitis, unspecified: Secondary | ICD-10-CM | POA: Diagnosis not present

## 2018-06-04 DIAGNOSIS — J069 Acute upper respiratory infection, unspecified: Secondary | ICD-10-CM | POA: Diagnosis not present

## 2018-06-04 NOTE — Telephone Encounter (Signed)
Pt. Had a My Chart request for an appointment for cough and congestion. Attempted 4 times to call pt. Unable to leave message, mailbox is full.

## 2018-06-23 DIAGNOSIS — J069 Acute upper respiratory infection, unspecified: Secondary | ICD-10-CM | POA: Diagnosis not present

## 2018-07-22 ENCOUNTER — Ambulatory Visit (INDEPENDENT_AMBULATORY_CARE_PROVIDER_SITE_OTHER): Payer: Medicare Other | Admitting: Sports Medicine

## 2018-07-22 ENCOUNTER — Encounter: Payer: Self-pay | Admitting: Sports Medicine

## 2018-07-22 ENCOUNTER — Ambulatory Visit: Payer: Self-pay

## 2018-07-22 VITALS — BP 136/70 | HR 59 | Ht 71.0 in | Wt 286.2 lb

## 2018-07-22 DIAGNOSIS — G8929 Other chronic pain: Secondary | ICD-10-CM | POA: Diagnosis not present

## 2018-07-22 DIAGNOSIS — M1712 Unilateral primary osteoarthritis, left knee: Secondary | ICD-10-CM

## 2018-07-22 DIAGNOSIS — M25562 Pain in left knee: Secondary | ICD-10-CM

## 2018-07-22 DIAGNOSIS — Z23 Encounter for immunization: Secondary | ICD-10-CM | POA: Diagnosis not present

## 2018-07-22 MED ORDER — CYCLOBENZAPRINE HCL 5 MG PO TABS: 5.0000 mg | ORAL_TABLET | Freq: Every day | ORAL | 1 refills | Status: DC

## 2018-07-22 NOTE — Progress Notes (Signed)
Craig Farrell. , [place] [facility] [place]  Craig Farrell - 68 y.o. male MRN [id number]  Date of birth: 1951/04/13  Visit Date: 07/22/2018  PCP: Marin Olp, MD   Referred by: Marin Olp, MD   SUBJECTIVE:   Chief Complaint  Patient presents with  . Follow-up    L knee and L hip pain.  Twisted L knee approx one week prior.    HPI: Patient presents today for worsening left knee and left thigh pain.  Reports feeling so he stepped on the yard while working in the yard and has had almost a week of stiffness and tightness that initially improved but has now plateaued for the past 3 to 4 days.  He is getting some mechanical clicking and popping.  He is had some numbness in his left foot but this is unchanged.  He has done well with Monovisc injections previously and is interested in having Visco supplementation repeated again today.  He is not a candidate for corticosteroid injections given the prior sensitivities especially with the Zilretta injections however he does report this is the best that his knee is felt in years.  This is not a Worker's compensation related claim is that was related to his right hip and left shin  REVIEW OF SYSTEMS: He is having some nighttime awakenings and stiffness due to the pain with this. He is having numbness and tingling in the left lower leg that is chronic and unchanging not related to his specific left knee pain. Unchanged lower extremity edema. Otherwise 12 point review of systems is negative.  HISTORY:  Prior history reviewed and updated per electronic medical record.  Social History   Occupational History  . Not on file  Tobacco Use  . Smoking status: Never Smoker  . Smokeless tobacco: Never Used  Substance and Sexual Activity  . Alcohol use: No  . Drug use: No  . Sexual activity: Not on file   Social History   Social History Narrative   Married 1973. 1 child 38 in [place]-  will be in [place] still. 74 month old grandbaby 04/2017.       3 years of college. 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       Two cups caffeine daily.      Right-handed.      DATA OBTAINED & REVIEWED:   Recent Labs    08/25/17 1018 09/18/17 1316 10/21/17 1027 01/19/18 1601  LABURIC  --   --  8.3*  --   CALCIUM  --  9.6 10.0  --   AST  --   --  28  --   ALT  --   --  31  --   TSH 2.63  --  2.51 1.53   No problems updated. 12/16/2017- Monovisc - Medicare covers 80%, USAA covers 20% coinsurance.   08/18/2017 - orthovisc is covered by Medicare 80/20 after deductible has been met, no copay, OOP does not apply. [name] is part F supplement and insurance responsibility is 100%.  09/10/17 - Zilretta: Medicare will cover 80%, deductible has been met. [employer] will cover medicare copay and coinsurance. - [facility]  OBJECTIVE:  VS:  HT:_0  (180.3 cm)   WT:286 lb 3.2 oz (129.8 kg)  BMI:39.93    BP:136/70  HR:(!) 59bpm  TEMP: ( )  RESP:96 %  PHYSICAL EXAM: Adult male.  No acute distress.  Alert and appropriate.  His left knee is overall well aligned with a moderate amount of generalized osteoarthritic bossing.  He has a moderate degree of synovitis with a small effusion.  He has pain with ligamentous stressing especially with valgus stressing and does have a solid endpoint with 3 to 4 mm of opening.  Pain with patellar grind. He has good internal and external rotation of bilateral hips today.  Negative straight leg raises bilaterally.   ASSESSMENT   1. Chronic pain of left knee   2. Need for influenza vaccination   3. Primary osteoarthritis of left knee     PLAN:  Pertinent additional documentation may be included in corresponding procedure notes, imaging studies, problem based documentation and patient instructions.  Procedures:  None  Medications:  Meds ordered this encounter    Medications  . cyclobenzaprine (FLEXERIL) 5 MG tablet    Sig: Take 1 tablet (5 mg total) by mouth at bedtime.    Dispense:  30 tablet    Refill:  1    Discussion/Instructions: No problem-specific Assessment & Plan notes found for this encounter.  Ultimately he is a candidate for repeat Visco supplementation at this time.  We will try to get him prior authorized and verification of benefits prior to proceeding with this.  He will plan to follow-up for a procedure only visit to have this performed.  If any lack of improvement can consider referral for surgical consultation.  Continue with compression, icing, home therapeutic exercises, Tylenol  >50% of this 25 minute visit spent in direct patient counseling and/or coordination of care.  Discussion was focused on education regarding the in discussing the pathoetiology and anticipated clinical course of the above condition.  Return for knee injection once benefits are verified.          Gerda Diss, [facility] Physician

## 2018-07-28 ENCOUNTER — Encounter: Payer: Self-pay | Admitting: Sports Medicine

## 2018-07-28 ENCOUNTER — Ambulatory Visit (INDEPENDENT_AMBULATORY_CARE_PROVIDER_SITE_OTHER): Payer: Medicare Other | Admitting: Sports Medicine

## 2018-07-28 ENCOUNTER — Ambulatory Visit: Payer: Self-pay

## 2018-07-28 VITALS — BP 180/90 | HR 63 | Ht 71.0 in | Wt 287.4 lb

## 2018-07-28 DIAGNOSIS — M1712 Unilateral primary osteoarthritis, left knee: Secondary | ICD-10-CM

## 2018-07-28 NOTE — Patient Instructions (Signed)

## 2018-07-28 NOTE — Procedures (Signed)
Patient presents for procedure only visit for left-sided Monovisc injection.  PROCEDURE NOTE:  Ultrasound Guided: Aspiration and Injection: Left knee, Intra-articular Images were obtained and interpreted by myself, Teresa Coombs, DO  Images have been saved and stored to PACS system. Images obtained on: GE S7 Ultrasound machine    ULTRASOUND FINDINGS:  Moderate effusion, generalized synovitis.  DESCRIPTION OF PROCEDURE:  The patient's clinical condition is marked by substantial pain and/or significant functional disability. Other conservative therapy has not provided relief, is contraindicated, or not appropriate. There is a reasonable likelihood that injection will significantly improve the patient's pain and/or functional impairment.   After discussing the risks, benefits and expected outcomes of the injection and all questions were reviewed and answered, the patient wished to undergo the above named procedure.  Verbal consent was obtained.  The ultrasound was used to identify the target structure and adjacent neurovascular structures. The skin was then prepped in sterile fashion and the target structure was injected under direct visualization using sterile technique as below:  Single injection performed as below: PREP: Alcohol, Ethel Chloride and 5 cc 1% lidocaine on 25g 1.5 in. needle APPROACH:superiolateral, stopcock technique, 18g 1.5 in. INJECTATE: 4cc MonoVisc ASPIRATE: 70mL  and straw colored  DRESSING: Band-Aid  Post procedural instructions including recommending icing and warning signs for infection were reviewed.    This procedure was well tolerated and there were no complications.   IMPRESSION: Succesful Ultrasound Guided: Aspiration and Injection

## 2018-08-05 ENCOUNTER — Encounter: Payer: Self-pay | Admitting: Family Medicine

## 2018-08-11 ENCOUNTER — Telehealth: Payer: Self-pay | Admitting: Family Medicine

## 2018-08-11 MED ORDER — CELEBREX 200 MG PO CAPS: 200.0000 mg | ORAL_CAPSULE | Freq: Every day | ORAL | 0 refills | Status: DC

## 2018-08-11 NOTE — Telephone Encounter (Signed)
See note

## 2018-08-11 NOTE — Telephone Encounter (Signed)
Per MyChart message -  Hi,   I had recommended a visit sometime in December after our last visit. I don't see we have a visit planned. It's been more than 6 months since we checked your kidney function. Celebrex can affect the kidneys-I would advise you to schedule a visit. We can order your medications at that time.   If the visit is going to be more than a few weeks out for my next available-please let me know and I will trial a 30-day supply of the medication. I would strongly prefer to see you first though to recheck your blood pressure-looks like it was elevated on last check.   I would also read the fine print on the savings card-a lot of times they do not work for [employer] patients-but I certainly hope it works for you!  Thanks so much and see you soon, [name]    Looks like OV has been scheduled with Dr. Yong Channel on 08/18/2018.

## 2018-08-11 NOTE — Telephone Encounter (Signed)
Rx sent to pharmacy for Celebrex 200 mg, take 1 po daily #30/0 DAW.

## 2018-08-11 NOTE — Telephone Encounter (Signed)
Copied from [place]. Topic: General - Other >> Aug 11, 2018  9:46 AM Keene Breath wrote: Reason for CRM: Patient called to request that the doctor send in a 30-day supply of his medication (Celebrex) because he is all out and he sent a message through My Chart explaining to the doctor.  Patient does have an appt. Schedule as well.  Please advise and send to pharmacy as soon as possible.  CB# [phone]

## 2018-08-18 ENCOUNTER — Other Ambulatory Visit: Payer: Self-pay

## 2018-08-18 ENCOUNTER — Ambulatory Visit (INDEPENDENT_AMBULATORY_CARE_PROVIDER_SITE_OTHER): Payer: Medicare Other | Admitting: Family Medicine

## 2018-08-18 ENCOUNTER — Encounter: Payer: Self-pay | Admitting: Family Medicine

## 2018-08-18 VITALS — BP 130/80 | HR 56 | Temp 98.0°F | Ht 71.0 in | Wt 284.8 lb

## 2018-08-18 DIAGNOSIS — M1990 Unspecified osteoarthritis, unspecified site: Secondary | ICD-10-CM

## 2018-08-18 DIAGNOSIS — E039 Hypothyroidism, unspecified: Secondary | ICD-10-CM

## 2018-08-18 DIAGNOSIS — E785 Hyperlipidemia, unspecified: Secondary | ICD-10-CM | POA: Diagnosis not present

## 2018-08-18 DIAGNOSIS — Z23 Encounter for immunization: Secondary | ICD-10-CM | POA: Diagnosis not present

## 2018-08-18 DIAGNOSIS — I10 Essential (primary) hypertension: Secondary | ICD-10-CM

## 2018-08-18 LAB — CBC
HCT: 47.4 % (ref 39.0–52.0)
Hemoglobin: 16.1 g/dL (ref 13.0–17.0)
MCHC: 34 g/dL (ref 30.0–36.0)
MCV: 98.4 fl (ref 78.0–100.0)
Platelets: 226 10*3/uL (ref 150.0–400.0)
RBC: 4.82 Mil/uL (ref 4.22–5.81)
RDW: 13.1 % (ref 11.5–15.5)
WBC: 7.8 10*3/uL (ref 4.0–10.5)

## 2018-08-18 LAB — COMPREHENSIVE METABOLIC PANEL
ALT: 34 U/L (ref 0–53)
AST: 33 U/L (ref 0–37)
Albumin: 4.5 g/dL (ref 3.5–5.2)
Alkaline Phosphatase: 53 U/L (ref 39–117)
BUN: 14 mg/dL (ref 6–23)
CO2: 29 mEq/L (ref 19–32)
Calcium: 10.4 mg/dL (ref 8.4–10.5)
Chloride: 102 mEq/L (ref 96–112)
Creatinine, Ser: 0.91 mg/dL (ref 0.40–1.50)
GFR: 83.07 mL/min (ref >60.00)
Glucose, Bld: 84 mg/dL (ref 70–99)
Potassium: 4.6 mEq/L (ref 3.5–5.1)
Sodium: 139 mEq/L (ref 135–145)
Total Bilirubin: 0.8 mg/dL (ref 0.2–1.2)
Total Protein: 7.2 g/dL (ref 6.0–8.3)

## 2018-08-18 LAB — LIPID PANEL
Cholesterol: 215 mg/dL — ABNORMAL HIGH (ref 0–200)
HDL: 36.8 mg/dL — ABNORMAL LOW (ref >39.00)
NonHDL: 178.21
Total CHOL/HDL Ratio: 6
Triglycerides: 337 mg/dL — ABNORMAL HIGH (ref 0.0–149.0)
VLDL: 67.4 mg/dL — ABNORMAL HIGH (ref 0.0–40.0)

## 2018-08-18 LAB — TSH: TSH: 2.49 u[IU]/mL (ref 0.35–4.50)

## 2018-08-18 LAB — LDL CHOLESTEROL, DIRECT: Direct LDL: 122 mg/dL

## 2018-08-18 MED ORDER — ATORVASTATIN CALCIUM 10 MG PO TABS: 10.0000 mg | ORAL_TABLET | Freq: Every day | ORAL | 3 refills | Status: DC

## 2018-08-18 MED ORDER — CELECOXIB 200 MG PO CAPS: 200.0000 mg | ORAL_CAPSULE | Freq: Every day | ORAL | 5 refills | Status: DC

## 2018-08-18 MED ORDER — TADALAFIL 20 MG PO TABS: 20.0000 mg | ORAL_TABLET | ORAL | 5 refills | Status: DC | PRN

## 2018-08-18 NOTE — Progress Notes (Signed)
Phone [phone]   Subjective:  Craig Farrell is a 68 y.o. year old very pleasant male patient who presents for/with See problem oriented charting ROS- No chest pain or shortness of breath reported.  Has had some knee pain.  No edema  Past Medical History-  Patient Active Problem List   Diagnosis Date Noted  . Morbid obesity ([place]) 05/07/2017    Priority: High  . Elevated PSA 01/19/2018    Priority: Medium  . Hyperlipidemia 06/23/2017    Priority: Medium  . BPH associated with nocturia 06/16/2017    Priority: Medium  . Primary osteoarthritis of left knee 05/07/2017    Priority: Medium  . Sleep apnea treated with nocturnal BiPAP     Priority: Medium  . Arthritis     Priority: Medium  . Hypertension     Priority: Medium  . Hypothyroidism     Priority: Medium  . History of adenomatous polyp of colon     Priority: Medium  . Tinnitus 05/07/2017    Priority: Low  . Allergic rhinitis     Priority: Low  . Yellow jacket sting allergy     Priority: Low  . Erectile dysfunction     Priority: Low  . Greater trochanteric pain syndrome 01/26/2018  . Episodic cluster headache, not intractable 10/14/2017  . Migraine without aura and without status migrainosus, not intractable 10/14/2017  . Primary osteoarthritis of left hip 07/28/2017  . Hypersomnia with sleep apnea 07/15/2017    Medications- reviewed and updated Current Outpatient Medications  Medication Sig Dispense Refill  . acetaminophen (TYLENOL) 500 MG tablet Take by mouth as needed.    . ASPIRIN 81 PO Take 81 mg by mouth daily.     . Azelastine HCl 0.15 % SOLN SPRAY 2 SPRAY BY INTRANASAL ROUTE EVERY DAY IN EACH NOSTRIL 90 mL 1  . Ca Phosphate-Cholecalciferol [phone] MG-UNIT TABS Take by mouth.    . CELEBREX 200 MG capsule Take 1 capsule (200 mg total) by mouth daily. 30 capsule 0  . Cholecalciferol (GNP VITAMIN D MAXIMUM STRENGTH) 2000 units TABS Take 2,000 Units by mouth 2 (two) times daily.    . cyclobenzaprine  (FLEXERIL) 5 MG tablet Take 1 tablet (5 mg total) by mouth at bedtime. 30 tablet 1  . lisinopril (PRINIVIL,ZESTRIL) 10 MG tablet Take 1 tablet (10 mg total) by mouth daily. 90 tablet 1  . tadalafil (CIALIS) 20 MG tablet Take 1 tablet (20 mg total) by mouth as needed. 10 tablet 5     Objective:  BP 130/80 (BP Location: Left Arm, Patient Position: Sitting, Cuff Size: Large)   Pulse (!) 56   Temp 98 F (36.7 C) (Oral)   Ht 5\' 11"  (1.803 m)   Wt 284 lb 12.8 oz (129.2 kg)   SpO2 100%   BMI 39.72 kg/m  Gen: NAD, resting comfortably CV: RRR no murmurs rubs or gallops Lungs: CTAB no crackles, wheeze, rhonchi Abdomen: soft/nontender/nondistended/obese Ext: no edema Skin: warm, dry Neuro: Speech normal, slight limp due to knee pain    Assessment and Plan    #Hypertension S: Compliant with lisinopril 10 mg, propranolol 60 mg extended release A/P:  Stable. Continue current medications.      #Hypothyroidism S: Treated with Synthroid 25 mcg in the past-we took patient off and TSH levels remain normal A/P: Hopefully stable-update TSH today-reintroduce Synthroid/levothyroxine if needed   #Hyperlipidemia S: Patient has wanted to work on lifestyle changes instead of starting medication.  He would like to make progress toward 250-remains  in the 280s though A/P: We will assess control today-update lipids.  Calculate 10-year risk and may need to consider statin.  #Arthritis S: Continued issues in knees, hips, shoulders.  He is taking Celebrex 200 mg daily-has been very expensive for him A/P: Refilled medication.  Discussed with long-term Cox 2 inhibitor use would need to monitor renal function-update BMP today and advised every 34-month visit at least for recheck  Printed prescriptions for patient as he wants to price shop  Lab/Order associations: FASTING Essential hypertension  Hyperlipidemia, unspecified hyperlipidemia type - Plan: CBC, Comprehensive metabolic panel, Lipid  panel  Hypothyroidism, unspecified type - Plan: TSH  Need for prophylactic vaccination against Streptococcus pneumoniae (pneumococcus) - Plan: Pneumococcal polysaccharide vaccine 23-valent greater than or equal to 2yo subcutaneous/IM  Meds ordered this encounter  Medications  . tadalafil (CIALIS) 20 MG tablet    Sig: Take 1 tablet (20 mg total) by mouth as needed.    Dispense:  15 tablet    Refill:  5  . celecoxib (CELEBREX) 200 MG capsule    Sig: Take 1 capsule (200 mg total) by mouth daily.    Dispense:  30 capsule    Refill:  5   Return precautions advised.  [name], MD

## 2018-08-18 NOTE — Patient Instructions (Addendum)
Pneumovax 23 today  Blood pressure looks better today!   Printed 2 prescriptions so you can price shop  Please stop by lab before you go If you do not have mychart- we will call you about results within 5 business days of [place] receiving them.  If you have mychart- we will send your results within 3 business days of [place] receiving them.  If abnormal or we want to clarify a result, we will call or mychart you to make sure you receive the message.  If you have questions or concerns or don't hear within 5-7 days, please send [place] a message or call us.

## 2018-08-25 ENCOUNTER — Other Ambulatory Visit: Payer: Self-pay | Admitting: Neurology

## 2018-08-25 ENCOUNTER — Encounter: Payer: Self-pay | Admitting: Neurology

## 2018-08-25 DIAGNOSIS — G44019 Episodic cluster headache, not intractable: Secondary | ICD-10-CM

## 2018-08-25 DIAGNOSIS — G4731 Primary central sleep apnea: Secondary | ICD-10-CM

## 2018-08-30 ENCOUNTER — Encounter: Payer: Self-pay | Admitting: Neurology

## 2018-08-30 ENCOUNTER — Encounter: Payer: Self-pay | Admitting: Nurse Practitioner

## 2018-08-30 ENCOUNTER — Telehealth: Payer: Self-pay | Admitting: Neurology

## 2018-08-30 NOTE — Progress Notes (Signed)
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  He could not tolerate CPAP.  He says he feels the best he has in a long time.  He does not have daytime drowsiness.  Compliance 08/01/2018-08/30/2018 shows compliance greater than 4 hours at 100%.  Average usage 8 hours 55 minutes pressure 4-14cm.  AHI 0.2.  ESS 3.  He has multiple questions about central apnea but causes it etc. he returns for reevaluation.  He has switched his supply company to [employer]  3/27/19CDJames Farrell is a 68 y.o. male , seen here as revisit from Dr. Yong Channel for a re- evaluation,  Craig Farrell has an amazing story to tell.   Apparently he had sleep studies and sleep related medical follow-ups in different locations in a different state.   He had an evaluation of sleep apnea at [facility] sleep-  And was placed in a trailer, where he could not sleep.  He was seen later  by [facility] on 06 February 2012 underwent a CPAP bilevel and ASV titration.  CPAP was initiated at 4 cmH2O after the patient had a documented AHI of 81/h he was initiated on 9 cmH2O pressure but developed central apneas.  His very first polysomnogram quoted by Dr. Thedore Mins was from 06 July 2009.  Apparently CPAP did not help with the resolution of apnea the patient was therefore switched to a bilevel first at 8/4 centimeter then a 10/4 centimeter and then a backup rate of 10 bpm was employed, again central apneas emerged the patient was finally titrated to ASV.  The diagnosis was now complex sleep apnea which improved on ASV to an AHI of 8/hr.  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  Minimum pressure support 4 cm, maximum pressure  support 14 cm with a ResMed Swift fracture nasal seal interface.  In the meantime he had moved to [place] where his ASV machine was not interrogated.  He was actually not treated, he was just given a new S10 machine, which has not been set to be usable for him.  He has basically of brand new machine.  He did receive 1 refill for his CPAP supplies.  He used them with his S9 machine.The supplies are 45-year-old.  Chief complaint according to patient : The patient needs to establish sleep care but he also needs supplies.  His last supplies are over a year old, download from his ASV machine which still works excellently shows 100% compliance, the patient uses it on average 9 hours and 24 minutes at night, expiratory pressure of 4 cmH2O minimum pressure support 4 cmH2O maximum pressure support 14 cmH2O residual AHI is 0.8/hr. He has some high air leaks but these are related to tubing and interface and facial hair.  Sleep habits are as follows: Bedtime is usually between 9 and 10 PM, he usually reads before he retreats to bed he does not have any trouble falling asleep, staying asleep.  He has usually one bathroom break around 2 AM, some nights that will be a second between 4 and 5 AM.  Patient sleeps on his right side only with 2 pillows for head support. His bedroom is cool, quiet and dark. His wife has  noticed significant air leaks that she can sense and she has noted that he can now snore through the CPAP indicating air seal is broken.The couple rises usually at 7 AM, they are keeping the 31 months old granddaughter.   REVIEW OF SYSTEMS: Full 14 system review of systems performed and notable only for those listed, all others are neg:  Constitutional: neg  Cardiovascular: neg Ear/Nose/Throat: neg  Skin: neg Eyes: neg Respiratory: neg Gastroitestinal: neg  Hematology/Lymphatic: neg  Endocrine: neg Musculoskeletal:neg Allergy/Immunology: neg Neurological: neg Psychiatric: neg Sleep : Central  sleep apnea with ASV   ALLERGIES: Allergies  Allergen Reactions  . Aleve [Naproxen]     Pt reports "shakiness"   . Yellow Jacket Venom [Bee Venom] Anaphylaxis  . Zilretta [Triamcinolone Acetonide] Palpitations  . Augmentin [Amoxicillin-Pot Clavulanate] Other (See Comments)    Bloody stool  . Ibuprofen Other (See Comments)    skaking  . Levaquin [Levofloxacin] Diarrhea  . Oxycodone Nausea And Vomiting  . Prednisone Other (See Comments)    Altered mental state  . Sudafed [Pseudoephedrine] Rash  . Vioxx [Rofecoxib] Rash    HOME MEDICATIONS: Outpatient Medications Prior to Visit  Medication Sig Dispense Refill  . acetaminophen (TYLENOL) 500 MG tablet Take by mouth as needed.    . ASPIRIN 81 PO Take 81 mg by mouth daily.     . Azelastine HCl 0.15 % SOLN SPRAY 2 SPRAY BY INTRANASAL ROUTE EVERY DAY IN EACH NOSTRIL 90 mL 1  . celecoxib (CELEBREX) 200 MG capsule Take 1 capsule (200 mg total) by mouth daily. 30 capsule 5  . Cholecalciferol (GNP VITAMIN D MAXIMUM STRENGTH) 2000 units TABS Take 2,000 Units by mouth daily.     . cyclobenzaprine (FLEXERIL) 5 MG tablet Take 1 tablet (5 mg total) by mouth at bedtime. 30 tablet 1  . lisinopril (PRINIVIL,ZESTRIL) 10 MG tablet Take 1 tablet (10 mg total) by mouth daily. 90 tablet 1  . tadalafil (CIALIS) 20 MG tablet Take 1 tablet (20 mg total) by mouth as needed. 15 tablet 5  . atorvastatin (LIPITOR) 10 MG tablet Take 1 tablet (10 mg total) by mouth daily. (Patient not taking: Reported on 08/31/2018) 90 tablet 3  . Ca Phosphate-Cholecalciferol [phone] MG-UNIT TABS Take by mouth.     No facility-administered medications prior to visit.     PAST MEDICAL HISTORY: Past Medical History:  Diagnosis Date  . Allergic rhinitis    astelin  . Arthritis    Hips and shoulders. celebrex 200mg  BID. plans to cut down with medicare costs  . Blood in stool    augmentin  . Erectile dysfunction    cialis 20mg   . Hx of diverticulitis of colon   .  Hypertension    lisinopril 10mg   . Hypothyroidism    synthroid 25 mcg  . OSA on CPAP   . Sleep apnea    ASV machine  . Tubular adenoma of colon    2014- was told q3 years- needs update. likely adenoma  . UTI (urinary tract infection)    x1- in hospital.   . Yellow jacket sting allergy    epi pens    PAST SURGICAL HISTORY: Past Surgical History:  Procedure Laterality Date  . HAND SURGERY Right   . left foot surgery     fracture related  . left knee surgery     1980    FAMILY HISTORY: Family History  Problem Relation Age of Onset  . Arthritis Mother   . Colon cancer Mother  late 5s  . Arthritis Sister   . Arthritis Sister   . Heart failure Father   . Parkinson's disease Father   . Dementia Father   . Colon cancer Maternal Aunt   . Healthy Child     SOCIAL HISTORY: Social History   Socioeconomic History  . Marital status: Married    Spouse name: Not on file  . Number of children: 1  . Years of education: 72  . Highest education level: Not on file  Occupational History  . Not on file  Social Needs  . Financial resource strain: Not on file  . Food insecurity:    Worry: Not on file    Inability: Not on file  . Transportation needs:    Medical: Not on file    Non-medical: Not on file  Tobacco Use  . Smoking status: Never Smoker  . Smokeless tobacco: Never Used  Substance and Sexual Activity  . Alcohol use: No  . Drug use: No  . Sexual activity: Not on file  Lifestyle  . Physical activity:    Days per week: Not on file    Minutes per session: Not on file  . Stress: Not on file  Relationships  . Social connections:    Talks on phone: Not on file    Gets together: Not on file    Attends religious service: Not on file    Active member of club or organization: Not on file    Attends meetings of clubs or organizations: Not on file    Relationship status: Not on file  . Intimate partner violence:    Fear of current or ex partner: Not on file     Emotionally abused: Not on file    Physically abused: Not on file    Forced sexual activity: Not on file  Other Topics Concern  . Not on file  Social History Narrative   Married 1973. 1 child 34 in [place]- will be in [place] still. 28 month old grandbaby 04/2017.       3 years of college. 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       Two cups caffeine daily.      Right-handed.     PHYSICAL EXAM  Vitals:   08/31/18 1507  BP: 133/75  Pulse: 64  Weight: 287 lb 3.2 oz (130.3 kg)  Height: 5\' 11"  (1.803 m)   Body mass index is 40.06 kg/m.  Generalized: Well developed, morbidly obese male in no acute distress  Head: normocephalic and atraumatic,. Oropharynx benign mallopatti3 Neck: Supple, 19.5 Lungs clear Musculoskeletal: No deformity  Skin no rash or edema Neurological examination   Mentation: Alert oriented to time, place, history taking. Attention span and concentration appropriate. Recent and remote memory intact.  Follows all commands speech and language fluent.   Cranial nerve II-XII: Pupils were equal round reactive to light extraocular movements were full, visual field were full on confrontational test. Facial sensation and strength were normal. hearing was intact to finger rubbing bilaterally. Uvula tongue midline. head turning and shoulder shrug were normal and symmetric.Tongue protrusion into cheek strength was normal. Motor: normal bulk and tone, full strength in the BUE, BLE, Sensory: normal and symmetric to light touch, Coordination: finger-nose-finger, heel-to-shin bilaterally, no dysmetria Gait and Station: Rising up from seated position without assistance, normal stance,  moderate stride, good arm swing, smooth turning, able to  perform tiptoe, and heel walking without difficulty. Tandem gait is steady.  Ambulates with a mild limp  DIAGNOSTIC DATA (LABS, IMAGING,  TESTING) - I reviewed patient records, labs, notes, testing and imaging myself where available.  Lab Results  Component Value Date   WBC 7.8 08/18/2018   HGB 16.1 08/18/2018   HCT 47.4 08/18/2018   MCV 98.4 08/18/2018   PLT 226.0 08/18/2018      Component Value Date/Time   NA 139 08/18/2018 1057   NA 138 06/04/2016   K 4.6 08/18/2018 1057   CL 102 08/18/2018 1057   CO2 29 08/18/2018 1057   GLUCOSE 84 08/18/2018 1057   BUN 14 08/18/2018 1057   BUN 10 06/04/2016   CREATININE 0.91 08/18/2018 1057   CALCIUM 10.4 08/18/2018 1057   PROT 7.2 08/18/2018 1057   ALBUMIN 4.5 08/18/2018 1057   AST 33 08/18/2018 1057   ALT 34 08/18/2018 1057   ALKPHOS 53 08/18/2018 1057   BILITOT 0.8 08/18/2018 1057   GFRNONAA >60 09/18/2017 1316   GFRAA >60 09/18/2017 1316   Lab Results  Component Value Date   CHOL 215 (H) 08/18/2018   HDL 36.80 (L) 08/18/2018   LDLDIRECT 122.0 08/18/2018   TRIG 337.0 (H) 08/18/2018   CHOLHDL 6 08/18/2018    Lab Results  Component Value Date   TSH 2.49 08/18/2018      ASSESSMENT AND PLAN  68 y.o. year old male  has a past medical history of  Arthritis,  Hypertension, Hypothyroidism, and central sleep apnea with ASV. Marland Kitchen His compliance is excellent.  He no longer has nocturia sleepiness or fatigue     ASV compliance 100% reviewed data with patient Continue same settings Follow-up in 6 months Given printout on central sleep apnea primarily due to lack of respiratory effort recurrent central apneas are the hallmark feature of this. Dennie Bible, [facility], [facility], APRN  [facility] [address] [place], [place] [address] [phone]

## 2018-08-30 NOTE — Telephone Encounter (Signed)
Called the patient to make him aware that I had sent everything over to [facility] last week but Dr Brett Fairy had to resend the order today. Advised the patient that since we are transferring care I have to complete everything just like we did when he first came here which means I have to send over to [place] office notes from [facility] where he had sleep study done and also [employer] stating he needs for insurance purposes a follow visit within 6 mths to state that he is compliant with the machine. I was able to go ahead and schedule the patient an apt tomorrow afternoon with Cecille Rubin, NP. I was able to get the information with his records from [facility] to send over for the patient and I have sent a mychart message to inform the patient. Once the office visit tomorrow then he should be taken care of and I also sent a message informing [employer] of the plan and faxed the information to them that they needed.

## 2018-08-31 ENCOUNTER — Encounter: Payer: Self-pay | Admitting: Nurse Practitioner

## 2018-08-31 ENCOUNTER — Ambulatory Visit (INDEPENDENT_AMBULATORY_CARE_PROVIDER_SITE_OTHER): Payer: Medicare Other | Admitting: Nurse Practitioner

## 2018-08-31 VITALS — BP 133/75 | HR 64 | Ht 71.0 in | Wt 287.2 lb

## 2018-08-31 DIAGNOSIS — G4730 Sleep apnea, unspecified: Secondary | ICD-10-CM

## 2018-08-31 NOTE — Patient Instructions (Signed)
ASV compliance 100% Continue same settings Follow-up in 6 months

## 2018-09-07 ENCOUNTER — Encounter: Payer: Self-pay | Admitting: Sports Medicine

## 2018-09-13 NOTE — Progress Notes (Signed)
Received from [facility] orders for cpap supplies.  Signed and fax confirmation received  09-13-18. [phone].

## 2018-09-24 ENCOUNTER — Other Ambulatory Visit: Payer: Self-pay | Admitting: Family Medicine

## 2018-09-24 MED ORDER — LISINOPRIL 10 MG PO TABS: 10.0000 mg | ORAL_TABLET | Freq: Every day | ORAL | 0 refills | Status: DC

## 2018-09-24 MED ORDER — CELECOXIB 200 MG PO CAPS: 200.0000 mg | ORAL_CAPSULE | Freq: Every day | ORAL | 0 refills | Status: DC

## 2018-09-24 NOTE — Telephone Encounter (Signed)
Please read note below

## 2018-09-24 NOTE — Telephone Encounter (Signed)
Requested Prescriptions  Pending Prescriptions Disp Refills  . lisinopril (PRINIVIL,ZESTRIL) 10 MG tablet 90 tablet 0    Sig: Take 1 tablet (10 mg total) by mouth daily.     Cardiovascular:  ACE Inhibitors Passed - 09/24/2018 12:09 PM      Passed - Cr in normal range and within 180 days    Creatinine, Ser  Date Value Ref Range Status  08/18/2018 0.91 0.40 - 1.50 mg/dL Final         Passed - K in normal range and within 180 days    Potassium  Date Value Ref Range Status  08/18/2018 4.6 3.5 - 5.1 mEq/L Final         Passed - Patient is not pregnant      Passed - Last BP in normal range    BP Readings from Last 1 Encounters:  08/31/18 133/75         Passed - Valid encounter within last 6 months    Recent Outpatient Visits          1 month ago Essential hypertension   [place], Brayton Mars, MD   1 month ago Primary osteoarthritis of left knee   [name], [place] D, DO   2 months ago Chronic pain of left knee   [place], Legrand Como D, DO   8 months ago Primary osteoarthritis of left knee   [facility] [place], [place] D, DO   8 months ago Hypothyroidism, unspecified type   [place], Brayton Mars, MD           . celecoxib (CELEBREX) 200 MG capsule 90 capsule 0    Sig: Take 1 capsule (200 mg total) by mouth daily.     Analgesics:  COX2 Inhibitors Passed - 09/24/2018 12:09 PM      Passed - HGB in normal range and within 360 days    Hemoglobin  Date Value Ref Range Status  08/18/2018 16.1 13.0 - 17.0 g/dL Final         Passed - Cr in normal range and within 360 days    Creatinine, Ser  Date Value Ref Range Status  08/18/2018 0.91 0.40 - 1.50 mg/dL Final         Passed - Patient is not pregnant      Passed - Valid encounter within last 12 months    Recent Outpatient Visits          1 month ago Essential hypertension   [place] [facility], Brayton Mars, MD   1 month ago Primary osteoarthritis of left knee   [facility] [place], [place] D, DO   2 months ago Chronic pain of left knee   [facility] [place], Legrand Como D, DO   8 months ago Primary osteoarthritis of left knee   [place], [place] D, DO   8 months ago Hypothyroidism, unspecified type   [place], Brayton Mars, MD

## 2018-09-24 NOTE — Telephone Encounter (Signed)
Copied from [place] [id number]. Topic: Quick Communication - Rx Refill/Question >> Sep 24, 2018 12:03 PM Sheran Luz wrote: Medication:  lisinopril (PRINIVIL,ZESTRIL) 10 MG tablet and celecoxib (CELEBREX) 200 MG capsule  Patient is requesting refills.   Has the patient contacted their pharmacy? Yes, patient was advised to contact office. Patient has remaining refills on celecoxib, however, he states the quantity was supposed to be 90 day. Please advise.   Preferred Pharmacy (with phone number or street name):[id number] - [place], [place]. AT [address]  [phone] (Phone) [phone] (Fax)

## 2018-10-04 ENCOUNTER — Telehealth: Payer: Self-pay | Admitting: Family Medicine

## 2018-10-04 NOTE — Telephone Encounter (Signed)
Copied from [place] [phone]. Topic: General - Other >> Oct 04, 2018  3:47 PM Leward Quan A wrote: Reason for CRM: Patient called to say that when he was in the office last he had a discussion with Dr Yong Channel regarding a nasal spray that stopped his nasal drips while he is eating. Stated that Dr Yong Channel was very familiar with the medicine but he cant remember the name. Patient said that he ran out and need an Rx sent in ASAP.

## 2018-10-04 NOTE — Telephone Encounter (Signed)
May refill Azelastine HCl 0.15 % SOLN

## 2018-10-04 NOTE — Telephone Encounter (Signed)
Please advise 

## 2018-10-05 ENCOUNTER — Encounter: Payer: Self-pay | Admitting: Family Medicine

## 2018-10-05 ENCOUNTER — Other Ambulatory Visit: Payer: Self-pay

## 2018-10-05 MED ORDER — AZELASTINE HCL 0.15 % NA SOLN: NASAL | 1 refills | Status: AC

## 2018-10-05 NOTE — Telephone Encounter (Signed)
Per Dr. Yong Channel, sent in refill for Azelastine nasal spray.  Called and spoke to patient and he states that this is not the nasal spray that he was referring to.  Patient reports that he was prescribed this nasal spray by a physician from [place].  I advised patient to contact his pharmacy [facility]) and get the name of this medication and contact us back.  At that time, I can forward that info on to Dr. Yong Channel to see if he is willing to send in rx for this.  Patient verbalized understanding.

## 2018-10-06 MED ORDER — IPRATROPIUM BROMIDE 0.06 % NA SOLN: 2.0000 | Freq: Four times a day (QID) | NASAL | 0 refills | Status: DC

## 2018-10-06 NOTE — Telephone Encounter (Signed)
Please see message and advise.  Thank you. ° °

## 2018-10-13 ENCOUNTER — Encounter: Payer: Self-pay | Admitting: Neurology

## 2018-10-14 ENCOUNTER — Other Ambulatory Visit: Payer: Self-pay

## 2018-10-14 MED ORDER — CELECOXIB 200 MG PO CAPS: 200.0000 mg | ORAL_CAPSULE | Freq: Every day | ORAL | 0 refills | Status: DC

## 2018-10-14 MED ORDER — LISINOPRIL 10 MG PO TABS: 10.0000 mg | ORAL_TABLET | Freq: Every day | ORAL | 0 refills | Status: DC

## 2018-10-14 NOTE — Telephone Encounter (Signed)
Rx electronically filled  Lisinopril 10mg  #90/0 Celebrex 200mg  #90/0  Craig Farrell L Marcelo Ickes, CMA

## 2018-11-07 ENCOUNTER — Other Ambulatory Visit: Payer: Self-pay | Admitting: Family Medicine

## 2018-12-01 ENCOUNTER — Other Ambulatory Visit: Payer: Self-pay | Admitting: Family Medicine

## 2018-12-01 ENCOUNTER — Encounter: Payer: Self-pay | Admitting: Family Medicine

## 2018-12-01 MED ORDER — LISINOPRIL 10 MG PO TABS: 10.0000 mg | ORAL_TABLET | Freq: Every day | ORAL | 0 refills | Status: DC

## 2018-12-01 MED ORDER — CELECOXIB 200 MG PO CAPS: 200.0000 mg | ORAL_CAPSULE | Freq: Every day | ORAL | 0 refills | Status: DC

## 2018-12-01 MED ORDER — IPRATROPIUM BROMIDE 0.06 % NA SOLN: 2.0000 | Freq: Four times a day (QID) | NASAL | 0 refills | Status: AC

## 2018-12-01 NOTE — Telephone Encounter (Signed)
Rx request 

## 2018-12-02 ENCOUNTER — Encounter: Payer: Self-pay | Admitting: Family Medicine

## 2018-12-02 ENCOUNTER — Other Ambulatory Visit: Payer: Self-pay

## 2018-12-02 ENCOUNTER — Other Ambulatory Visit: Payer: Self-pay | Admitting: Family Medicine

## 2018-12-02 MED ORDER — TADALAFIL 20 MG PO TABS: 20.0000 mg | ORAL_TABLET | ORAL | 5 refills | Status: DC | PRN

## 2018-12-02 MED ORDER — LISINOPRIL 10 MG PO TABS: 10.0000 mg | ORAL_TABLET | Freq: Every day | ORAL | 0 refills | Status: DC

## 2018-12-02 NOTE — Telephone Encounter (Signed)
Script sent to [facility] today.

## 2018-12-02 NOTE — Telephone Encounter (Signed)
I called patient today and he asked if Dr. Yong Channel could increase his quantity on Celecoxib 200mg  to 2 a day.  Pt said that he has been taking 2 day on some days.  Patient stated that he was frustrated with MyChart because he received a automated message in return after he tried to refill his other medications through [place].

## 2018-12-02 NOTE — Telephone Encounter (Signed)
Please see other Message.

## 2018-12-02 NOTE — Telephone Encounter (Signed)
Rx refill Tadalafil 20mg   #15/5 Last fill 08/18/18 Okay to refill?

## 2018-12-02 NOTE — Telephone Encounter (Signed)
Please see message prior to this.

## 2018-12-02 NOTE — Telephone Encounter (Signed)
Please see message prior to this one.

## 2018-12-03 NOTE — Telephone Encounter (Signed)
I sent in yesterday to [facility].

## 2018-12-07 ENCOUNTER — Telehealth: Payer: Self-pay | Admitting: Family Medicine

## 2018-12-07 NOTE — Telephone Encounter (Signed)
FYI  Spoke to pt to advise of update. Pt stated that he has talked to [place] already. I asked pt if he received the latest update after 12:40 pm and he stated that he did. Pt also stated " Why cant you people just leave me a vm so that I don't have to wait 15 minutes for a call to go through". I apologized to pt and stated I will see if I can add the note to the chart for next time. Pt stated " I don't think there will be a next time" and disconnected the call. Not sure if pt is leaving practice or not!

## 2018-12-07 NOTE — Telephone Encounter (Signed)
Left message to return phone call.

## 2018-12-07 NOTE — Telephone Encounter (Signed)
Pt returning call. He again is asking about Celebrex 2/day. Advised pt I will have Theadora Rama return his call. Pt has 3 doses left.   Ph# [phone]

## 2018-12-07 NOTE — Telephone Encounter (Signed)
I want patient to be happy with his care. My first job as a physician is to "do no harm". Considering current guidelines for Osteoarthritis include celebrex "200 mg once daily or 100 mg twice daily", I would not recommend higher dose. If he is not happy with me following current medical guidelines- then its certainly within his rights to seek care elsewhere. There may be other physicians that are more comfortable going above listed guidelines but I do not personally feel it is in his best interest.   Tell him thank you for the opportunity to be his physician if that is his decision to leave the practice.   Sending back to [place] as she has worked with him in the past.

## 2018-12-07 NOTE — Telephone Encounter (Signed)
Spoke with patient, he says that he has talked to Dr. Yong Channel about the Celebrex before in great detail. He said that he was taking Celebrex 200 mg BID for several years before transferring his care to Dr. Yong Channel. He said that it was even his idea to cut back to 200 mg daily because sometimes taking the 200 mg BID would cause him to have leg cramps. Per pt, he only takes 200 mg 2-3 days weekly and the rest of the week he only takes it daily. He takes it BID when he is more active and doing more around the house and in the yard because his pain flares up. He does not want to take 200 mg BID every day because he knows that he can get legs cramps if he does. He says that he is unable to take any other NSAIDs. He wants for Dr. Yong Channel to understand that he is only requesting enough medication for a 90 days supply which will allow him to take 200 mg BID 3 days a week and 200 mg once daily 4 days a week for his pain. If Dr. Yong Channel isn't willing to do this to help manage his pain, we will considering transferring care to another PCP outside of [place].   I discussed with him the possible side effects of being on higher doses of NSAIDs for long periods of time. He verbalized understanding and expressed that he has not had any trouble with his kidney function in the past, despite taking Celebrex at a higher than recommended daily dose.   He requests that I forward this information to Dr. Yong Channel to see if he will be willing to prescribe enough Celebrex 200 mg for him to be able to take once daily 4 days a week and twice daily 3 days a week.   Forwarding to Dr. Yong Channel to advise.

## 2018-12-07 NOTE — Telephone Encounter (Signed)
Could also offer referral to orthopedics for their opinion on his pains/arthritis

## 2018-12-07 NOTE — Telephone Encounter (Signed)
Copied from [place] [phone]. Topic: Quick Communication - See Telephone Encounter >> Dec 06, 2018  5:50 PM [place] wrote: CRM for notification. See Telephone encounter for: 12/06/18.celecoxib (CELEBREX) 200 MG capsule [id number] [place] [place] [address] [phone] (Phone) [phone] (Fax)  Pt called in stating that he has left Dr Yong Channel 4 messages on MyChart is Below is  one MyChart message... Patient is very irate & threatening  Hello Dr Yong Channel,  Did you receive the request for refills on my meds yesterday? If so, did you see my request to increase the quantity on my Celecoxib ? Today My Chart still shows "Out of Refills" on most of my Meds.  Also I sent you a second request for Tadalafil 20mg , 30 tablets to [facility] [address] for [facility] one...not [facility].  Please confirm.  I just talked to [facility] & they don't have this scrip.  It seems that celecoxib (CELEBREX) 200 MG capsule is his main concern. Both he and his wife were very aggressive on the same line and I gathered that the Celebrex has been picked up for the 90 disp. However he just wanted more. However he said that it was not time for it to be refilled and he was surprised that [facility]. I tried to calm him down by assuring him that someone would reach out on 5/19 for FU His number is [phone] Thanks,

## 2018-12-07 NOTE — Telephone Encounter (Signed)
Tadalafil refilled to [facility] 12/02/18 #15/0 [facility] #90/0 [facility] #48[facility]0 [facility]x [facility] 12/01/18 #90/0  Regarding request to increase Celebrex - "Dose recommendations are- "200 mg once daily or 100 mg twice daily"  Patient's requested amount is above recommended rx so cannot increase. I would be willing to change to 100mg  twice a day.  [name]"  Called pt and left VM to call the office.

## 2018-12-07 NOTE — Telephone Encounter (Signed)
Called patient and advised. He declines referral to this time. He said that his previous provider of 27 years prescribed this medication for him for 7 years so he doesn't understand this. He says that Dr. Yong Channel is "by the book, by the book, by the book" and if that's what he wants to do he will find care else where. He says that [facility] has "gone down hill". He says that Dr. Yong Channel was a pretty good doctor but if he isn't willing to give him what he needs for pain than that's that. He says that since [place] the care from staff has gone down hill. I apologized to pt for any inconvenience and told him to let me know if he changes his mind about the referral and wished him the best.

## 2018-12-22 DIAGNOSIS — M79672 Pain in left foot: Secondary | ICD-10-CM | POA: Diagnosis not present

## 2018-12-22 DIAGNOSIS — M25572 Pain in left ankle and joints of left foot: Secondary | ICD-10-CM | POA: Diagnosis not present

## 2018-12-22 DIAGNOSIS — M19072 Primary osteoarthritis, left ankle and foot: Secondary | ICD-10-CM | POA: Diagnosis not present

## 2019-01-24 DIAGNOSIS — M25572 Pain in left ankle and joints of left foot: Secondary | ICD-10-CM | POA: Diagnosis not present

## 2019-01-25 DIAGNOSIS — G4731 Primary central sleep apnea: Secondary | ICD-10-CM | POA: Diagnosis not present

## 2019-01-25 DIAGNOSIS — G8929 Other chronic pain: Secondary | ICD-10-CM | POA: Diagnosis not present

## 2019-01-25 DIAGNOSIS — I10 Essential (primary) hypertension: Secondary | ICD-10-CM | POA: Diagnosis not present

## 2019-01-25 DIAGNOSIS — Z91030 Bee allergy status: Secondary | ICD-10-CM | POA: Diagnosis not present

## 2019-01-25 DIAGNOSIS — E039 Hypothyroidism, unspecified: Secondary | ICD-10-CM | POA: Diagnosis not present

## 2019-01-25 DIAGNOSIS — E782 Mixed hyperlipidemia: Secondary | ICD-10-CM | POA: Diagnosis not present

## 2019-01-25 DIAGNOSIS — M2550 Pain in unspecified joint: Secondary | ICD-10-CM | POA: Diagnosis not present

## 2019-01-25 DIAGNOSIS — M25562 Pain in left knee: Secondary | ICD-10-CM | POA: Diagnosis not present

## 2019-01-25 DIAGNOSIS — J302 Other seasonal allergic rhinitis: Secondary | ICD-10-CM | POA: Diagnosis not present

## 2019-01-25 DIAGNOSIS — M1990 Unspecified osteoarthritis, unspecified site: Secondary | ICD-10-CM | POA: Diagnosis not present

## 2019-01-31 DIAGNOSIS — M25562 Pain in left knee: Secondary | ICD-10-CM | POA: Diagnosis not present

## 2019-01-31 DIAGNOSIS — M1712 Unilateral primary osteoarthritis, left knee: Secondary | ICD-10-CM | POA: Diagnosis not present

## 2019-02-02 DIAGNOSIS — J302 Other seasonal allergic rhinitis: Secondary | ICD-10-CM | POA: Diagnosis not present

## 2019-02-02 DIAGNOSIS — M25562 Pain in left knee: Secondary | ICD-10-CM | POA: Diagnosis not present

## 2019-02-02 DIAGNOSIS — G8929 Other chronic pain: Secondary | ICD-10-CM | POA: Diagnosis not present

## 2019-02-02 DIAGNOSIS — G4731 Primary central sleep apnea: Secondary | ICD-10-CM | POA: Diagnosis not present

## 2019-02-02 DIAGNOSIS — I10 Essential (primary) hypertension: Secondary | ICD-10-CM | POA: Diagnosis not present

## 2019-02-02 DIAGNOSIS — E039 Hypothyroidism, unspecified: Secondary | ICD-10-CM | POA: Diagnosis not present

## 2019-02-02 DIAGNOSIS — M2550 Pain in unspecified joint: Secondary | ICD-10-CM | POA: Diagnosis not present

## 2019-02-02 DIAGNOSIS — Z91030 Bee allergy status: Secondary | ICD-10-CM | POA: Diagnosis not present

## 2019-02-02 DIAGNOSIS — M1990 Unspecified osteoarthritis, unspecified site: Secondary | ICD-10-CM | POA: Diagnosis not present

## 2019-02-02 DIAGNOSIS — E782 Mixed hyperlipidemia: Secondary | ICD-10-CM | POA: Diagnosis not present

## 2019-02-07 DIAGNOSIS — M1712 Unilateral primary osteoarthritis, left knee: Secondary | ICD-10-CM | POA: Diagnosis not present

## 2019-02-14 DIAGNOSIS — M1712 Unilateral primary osteoarthritis, left knee: Secondary | ICD-10-CM | POA: Diagnosis not present

## 2019-02-21 DIAGNOSIS — M1712 Unilateral primary osteoarthritis, left knee: Secondary | ICD-10-CM | POA: Diagnosis not present

## 2019-02-24 IMAGING — DX DG KNEE AP/LAT W/ SUNRISE*L*
4 series · 4 of 4 positions shown · non-contrast
Comparison: None in PACs

CLINICAL DATA: Chronic left knee pain after falling on a concrete
surface 3 years ago. Intermittent flare ups of symptoms. Increased
pain over the past 10 days with no new injury.

EXAM:
LEFT KNEE 3 VIEWS

[knee standing ap]
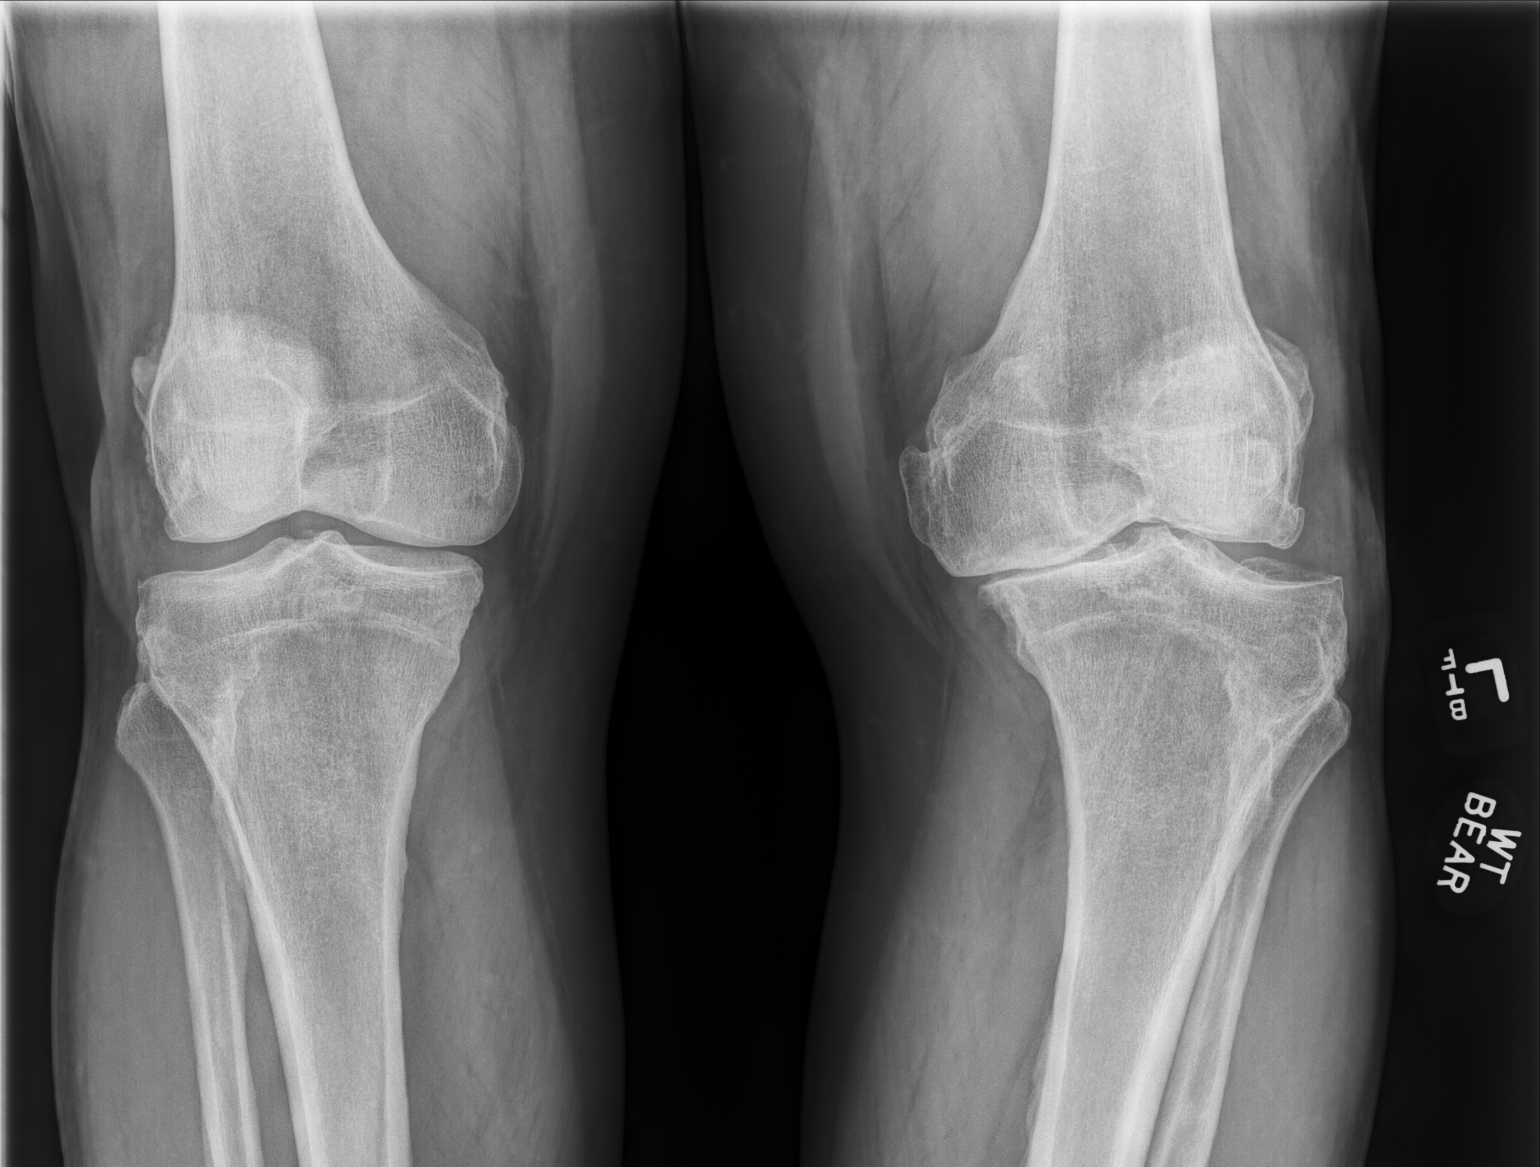

[knee standing lat]
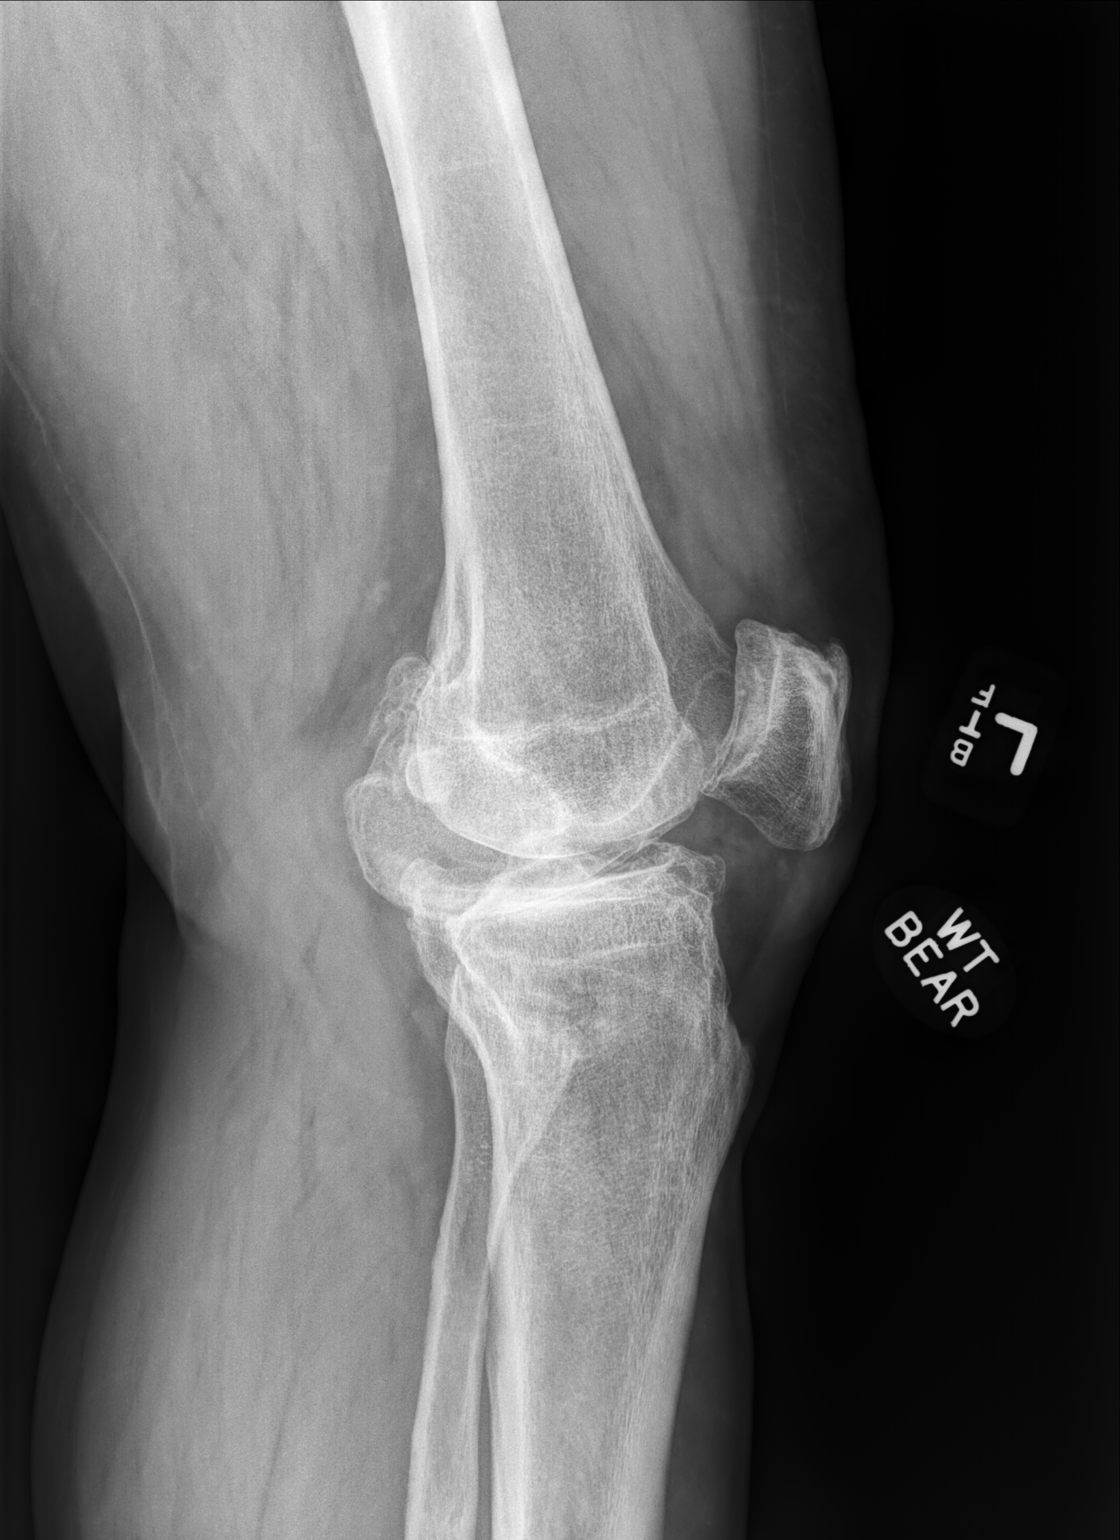

[sunrise (1 of 2)]
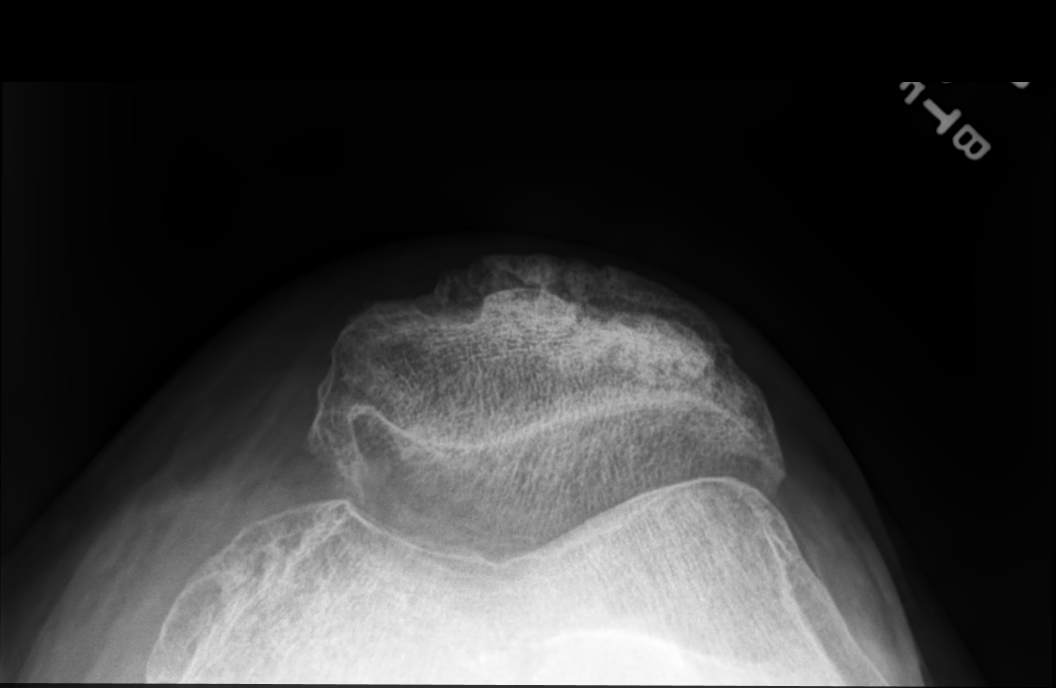

[sunrise (2 of 2)]
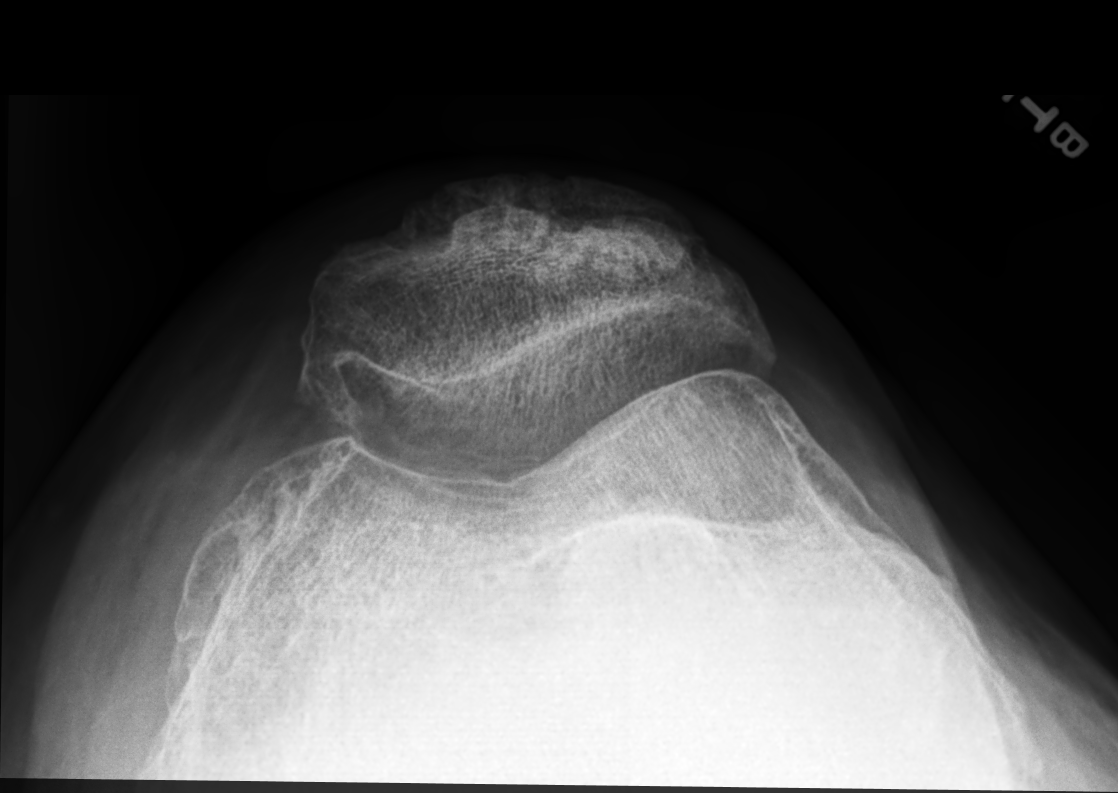

[4 of 4 positions shown; findings below may reference images not displayed]

FINDINGS: The bones are subjectively adequately mineralized. There is lateral
subluxation of the tibia with respect to the femoral condyles. There
is high-grade loss of the medial joint space. The lateral and
patellofemoral joint spaces are reasonably well-maintained. There is
no joint effusion. There no acute fracture.
IMPRESSION: There is severe degenerative change centered on the medial joint
compartment of the left knee with lateral subluxation of the tibia
with respect to the femoral condyles. There are milder degenerative
changes of the lateral and patellofemoral compartments. No acute or
old fracture is observed.

There is mild degenerative change seen on the single AP view of the
right knee.

## 2019-03-15 ENCOUNTER — Other Ambulatory Visit: Payer: Self-pay | Admitting: Family Medicine

## 2019-03-16 NOTE — Telephone Encounter (Signed)
Last OV 08/18/18, due for f/u visit per Dr. Yong Channel. RX has been refilled.   Please contact pt to schedule f/u visit per Dr. Yong Channel.

## 2019-03-16 NOTE — Telephone Encounter (Signed)
LVM FOR PATIENT TO CALL BACK AND SCHEDULE A F/U APPT  °

## 2019-03-16 NOTE — Telephone Encounter (Signed)
May refill but change to every 72 hours as needed.  Patient also needs an office visit with last visit over 6 months ago.

## 2019-03-16 NOTE — Telephone Encounter (Signed)
Last OV 08/18/18 Last refill 12/02/18 #15/5 Next OV not scheduled

## 2019-04-25 DIAGNOSIS — M25572 Pain in left ankle and joints of left foot: Secondary | ICD-10-CM | POA: Diagnosis not present

## 2019-04-26 ENCOUNTER — Other Ambulatory Visit: Payer: Self-pay | Admitting: Family Medicine

## 2019-05-12 ENCOUNTER — Encounter: Payer: Self-pay | Admitting: Family Medicine

## 2019-06-28 DIAGNOSIS — M25552 Pain in left hip: Secondary | ICD-10-CM | POA: Diagnosis not present

## 2019-06-28 DIAGNOSIS — M1712 Unilateral primary osteoarthritis, left knee: Secondary | ICD-10-CM | POA: Diagnosis not present

## 2019-06-28 DIAGNOSIS — M25551 Pain in right hip: Secondary | ICD-10-CM | POA: Diagnosis not present

## 2019-08-05 DIAGNOSIS — M7062 Trochanteric bursitis, left hip: Secondary | ICD-10-CM | POA: Diagnosis not present

## 2019-08-05 DIAGNOSIS — M1712 Unilateral primary osteoarthritis, left knee: Secondary | ICD-10-CM | POA: Diagnosis not present

## 2019-08-05 DIAGNOSIS — M7061 Trochanteric bursitis, right hip: Secondary | ICD-10-CM | POA: Diagnosis not present

## 2019-08-09 DIAGNOSIS — M7061 Trochanteric bursitis, right hip: Secondary | ICD-10-CM | POA: Diagnosis not present

## 2019-08-09 DIAGNOSIS — M7062 Trochanteric bursitis, left hip: Secondary | ICD-10-CM | POA: Diagnosis not present

## 2019-08-09 DIAGNOSIS — M1712 Unilateral primary osteoarthritis, left knee: Secondary | ICD-10-CM | POA: Diagnosis not present

## 2019-08-16 DIAGNOSIS — M1712 Unilateral primary osteoarthritis, left knee: Secondary | ICD-10-CM | POA: Diagnosis not present

## 2019-08-16 DIAGNOSIS — M7061 Trochanteric bursitis, right hip: Secondary | ICD-10-CM | POA: Diagnosis not present

## 2019-08-16 DIAGNOSIS — M7062 Trochanteric bursitis, left hip: Secondary | ICD-10-CM | POA: Diagnosis not present

## 2019-08-18 DIAGNOSIS — M7062 Trochanteric bursitis, left hip: Secondary | ICD-10-CM | POA: Diagnosis not present

## 2019-08-18 DIAGNOSIS — M7061 Trochanteric bursitis, right hip: Secondary | ICD-10-CM | POA: Diagnosis not present

## 2019-08-18 DIAGNOSIS — M1712 Unilateral primary osteoarthritis, left knee: Secondary | ICD-10-CM | POA: Diagnosis not present

## 2019-08-23 DIAGNOSIS — M7062 Trochanteric bursitis, left hip: Secondary | ICD-10-CM | POA: Diagnosis not present

## 2019-08-23 DIAGNOSIS — M7061 Trochanteric bursitis, right hip: Secondary | ICD-10-CM | POA: Diagnosis not present

## 2019-08-23 DIAGNOSIS — M1712 Unilateral primary osteoarthritis, left knee: Secondary | ICD-10-CM | POA: Diagnosis not present

## 2019-08-25 DIAGNOSIS — M7062 Trochanteric bursitis, left hip: Secondary | ICD-10-CM | POA: Diagnosis not present

## 2019-08-25 DIAGNOSIS — M1712 Unilateral primary osteoarthritis, left knee: Secondary | ICD-10-CM | POA: Diagnosis not present

## 2019-08-25 DIAGNOSIS — M7061 Trochanteric bursitis, right hip: Secondary | ICD-10-CM | POA: Diagnosis not present

## 2019-08-30 DIAGNOSIS — M7061 Trochanteric bursitis, right hip: Secondary | ICD-10-CM | POA: Diagnosis not present

## 2019-08-30 DIAGNOSIS — M1712 Unilateral primary osteoarthritis, left knee: Secondary | ICD-10-CM | POA: Diagnosis not present

## 2019-08-30 DIAGNOSIS — M7062 Trochanteric bursitis, left hip: Secondary | ICD-10-CM | POA: Diagnosis not present

## 2019-09-13 DIAGNOSIS — M7061 Trochanteric bursitis, right hip: Secondary | ICD-10-CM | POA: Diagnosis not present

## 2019-09-13 DIAGNOSIS — M7062 Trochanteric bursitis, left hip: Secondary | ICD-10-CM | POA: Diagnosis not present

## 2019-09-13 DIAGNOSIS — M1712 Unilateral primary osteoarthritis, left knee: Secondary | ICD-10-CM | POA: Diagnosis not present

## 2019-09-15 DIAGNOSIS — M1712 Unilateral primary osteoarthritis, left knee: Secondary | ICD-10-CM | POA: Diagnosis not present

## 2019-09-15 DIAGNOSIS — M7061 Trochanteric bursitis, right hip: Secondary | ICD-10-CM | POA: Diagnosis not present

## 2019-09-15 DIAGNOSIS — M7062 Trochanteric bursitis, left hip: Secondary | ICD-10-CM | POA: Diagnosis not present

## 2019-10-10 DIAGNOSIS — E782 Mixed hyperlipidemia: Secondary | ICD-10-CM | POA: Diagnosis not present

## 2019-10-10 DIAGNOSIS — I10 Essential (primary) hypertension: Secondary | ICD-10-CM | POA: Diagnosis not present

## 2019-10-10 DIAGNOSIS — E039 Hypothyroidism, unspecified: Secondary | ICD-10-CM | POA: Diagnosis not present

## 2019-10-10 DIAGNOSIS — M1990 Unspecified osteoarthritis, unspecified site: Secondary | ICD-10-CM | POA: Diagnosis not present

## 2019-10-20 DIAGNOSIS — M9905 Segmental and somatic dysfunction of pelvic region: Secondary | ICD-10-CM | POA: Diagnosis not present

## 2019-10-20 DIAGNOSIS — M9903 Segmental and somatic dysfunction of lumbar region: Secondary | ICD-10-CM | POA: Diagnosis not present

## 2019-10-20 DIAGNOSIS — M9904 Segmental and somatic dysfunction of sacral region: Secondary | ICD-10-CM | POA: Diagnosis not present

## 2019-10-20 DIAGNOSIS — M25462 Effusion, left knee: Secondary | ICD-10-CM | POA: Diagnosis not present

## 2019-10-20 DIAGNOSIS — M1712 Unilateral primary osteoarthritis, left knee: Secondary | ICD-10-CM | POA: Diagnosis not present

## 2019-10-20 DIAGNOSIS — M25562 Pain in left knee: Secondary | ICD-10-CM | POA: Diagnosis not present

## 2019-10-26 DIAGNOSIS — M722 Plantar fascial fibromatosis: Secondary | ICD-10-CM | POA: Diagnosis not present

## 2019-10-26 DIAGNOSIS — M19072 Primary osteoarthritis, left ankle and foot: Secondary | ICD-10-CM | POA: Diagnosis not present

## 2019-10-28 DIAGNOSIS — M722 Plantar fascial fibromatosis: Secondary | ICD-10-CM | POA: Diagnosis not present

## 2019-10-28 DIAGNOSIS — M19072 Primary osteoarthritis, left ankle and foot: Secondary | ICD-10-CM | POA: Diagnosis not present

## 2019-11-02 DIAGNOSIS — M722 Plantar fascial fibromatosis: Secondary | ICD-10-CM | POA: Diagnosis not present

## 2019-11-02 DIAGNOSIS — M19072 Primary osteoarthritis, left ankle and foot: Secondary | ICD-10-CM | POA: Diagnosis not present

## 2019-11-08 DIAGNOSIS — M722 Plantar fascial fibromatosis: Secondary | ICD-10-CM | POA: Diagnosis not present

## 2019-11-08 DIAGNOSIS — M19072 Primary osteoarthritis, left ankle and foot: Secondary | ICD-10-CM | POA: Diagnosis not present

## 2019-11-10 DIAGNOSIS — E039 Hypothyroidism, unspecified: Secondary | ICD-10-CM | POA: Diagnosis not present

## 2019-11-10 DIAGNOSIS — M1990 Unspecified osteoarthritis, unspecified site: Secondary | ICD-10-CM | POA: Diagnosis not present

## 2019-11-10 DIAGNOSIS — M722 Plantar fascial fibromatosis: Secondary | ICD-10-CM | POA: Diagnosis not present

## 2019-11-10 DIAGNOSIS — I10 Essential (primary) hypertension: Secondary | ICD-10-CM | POA: Diagnosis not present

## 2019-11-10 DIAGNOSIS — M19072 Primary osteoarthritis, left ankle and foot: Secondary | ICD-10-CM | POA: Diagnosis not present

## 2019-11-10 DIAGNOSIS — E782 Mixed hyperlipidemia: Secondary | ICD-10-CM | POA: Diagnosis not present

## 2019-11-16 DIAGNOSIS — M19072 Primary osteoarthritis, left ankle and foot: Secondary | ICD-10-CM | POA: Diagnosis not present

## 2019-11-16 DIAGNOSIS — M1712 Unilateral primary osteoarthritis, left knee: Secondary | ICD-10-CM | POA: Diagnosis not present

## 2019-11-16 DIAGNOSIS — M722 Plantar fascial fibromatosis: Secondary | ICD-10-CM | POA: Diagnosis not present

## 2019-11-18 DIAGNOSIS — M722 Plantar fascial fibromatosis: Secondary | ICD-10-CM | POA: Diagnosis not present

## 2019-11-18 DIAGNOSIS — M19072 Primary osteoarthritis, left ankle and foot: Secondary | ICD-10-CM | POA: Diagnosis not present

## 2019-11-23 DIAGNOSIS — M19072 Primary osteoarthritis, left ankle and foot: Secondary | ICD-10-CM | POA: Diagnosis not present

## 2019-11-23 DIAGNOSIS — M1712 Unilateral primary osteoarthritis, left knee: Secondary | ICD-10-CM | POA: Diagnosis not present

## 2019-11-23 DIAGNOSIS — M722 Plantar fascial fibromatosis: Secondary | ICD-10-CM | POA: Diagnosis not present

## 2019-11-25 DIAGNOSIS — M19072 Primary osteoarthritis, left ankle and foot: Secondary | ICD-10-CM | POA: Diagnosis not present

## 2019-11-25 DIAGNOSIS — M722 Plantar fascial fibromatosis: Secondary | ICD-10-CM | POA: Diagnosis not present

## 2019-11-29 DIAGNOSIS — M19072 Primary osteoarthritis, left ankle and foot: Secondary | ICD-10-CM | POA: Diagnosis not present

## 2019-11-29 DIAGNOSIS — M25462 Effusion, left knee: Secondary | ICD-10-CM | POA: Diagnosis not present

## 2019-11-29 DIAGNOSIS — M1712 Unilateral primary osteoarthritis, left knee: Secondary | ICD-10-CM | POA: Diagnosis not present

## 2019-11-29 DIAGNOSIS — M25562 Pain in left knee: Secondary | ICD-10-CM | POA: Diagnosis not present

## 2019-11-29 DIAGNOSIS — M722 Plantar fascial fibromatosis: Secondary | ICD-10-CM | POA: Diagnosis not present

## 2019-12-01 DIAGNOSIS — M19072 Primary osteoarthritis, left ankle and foot: Secondary | ICD-10-CM | POA: Diagnosis not present

## 2019-12-01 DIAGNOSIS — E782 Mixed hyperlipidemia: Secondary | ICD-10-CM | POA: Diagnosis not present

## 2019-12-01 DIAGNOSIS — I10 Essential (primary) hypertension: Secondary | ICD-10-CM | POA: Diagnosis not present

## 2019-12-01 DIAGNOSIS — M1990 Unspecified osteoarthritis, unspecified site: Secondary | ICD-10-CM | POA: Diagnosis not present

## 2019-12-01 DIAGNOSIS — E039 Hypothyroidism, unspecified: Secondary | ICD-10-CM | POA: Diagnosis not present

## 2019-12-01 DIAGNOSIS — M722 Plantar fascial fibromatosis: Secondary | ICD-10-CM | POA: Diagnosis not present

## 2019-12-06 DIAGNOSIS — M1712 Unilateral primary osteoarthritis, left knee: Secondary | ICD-10-CM | POA: Diagnosis not present

## 2019-12-06 DIAGNOSIS — M722 Plantar fascial fibromatosis: Secondary | ICD-10-CM | POA: Diagnosis not present

## 2019-12-06 DIAGNOSIS — M25462 Effusion, left knee: Secondary | ICD-10-CM | POA: Diagnosis not present

## 2019-12-06 DIAGNOSIS — M19072 Primary osteoarthritis, left ankle and foot: Secondary | ICD-10-CM | POA: Diagnosis not present

## 2019-12-08 DIAGNOSIS — M19072 Primary osteoarthritis, left ankle and foot: Secondary | ICD-10-CM | POA: Diagnosis not present

## 2019-12-08 DIAGNOSIS — M722 Plantar fascial fibromatosis: Secondary | ICD-10-CM | POA: Diagnosis not present

## 2019-12-12 DIAGNOSIS — M19072 Primary osteoarthritis, left ankle and foot: Secondary | ICD-10-CM | POA: Diagnosis not present

## 2019-12-12 DIAGNOSIS — M722 Plantar fascial fibromatosis: Secondary | ICD-10-CM | POA: Diagnosis not present

## 2019-12-16 DIAGNOSIS — M722 Plantar fascial fibromatosis: Secondary | ICD-10-CM | POA: Diagnosis not present

## 2019-12-16 DIAGNOSIS — M19072 Primary osteoarthritis, left ankle and foot: Secondary | ICD-10-CM | POA: Diagnosis not present

## 2019-12-21 DIAGNOSIS — M722 Plantar fascial fibromatosis: Secondary | ICD-10-CM | POA: Diagnosis not present

## 2019-12-21 DIAGNOSIS — M19072 Primary osteoarthritis, left ankle and foot: Secondary | ICD-10-CM | POA: Diagnosis not present

## 2019-12-23 DIAGNOSIS — M19072 Primary osteoarthritis, left ankle and foot: Secondary | ICD-10-CM | POA: Diagnosis not present

## 2019-12-23 DIAGNOSIS — M722 Plantar fascial fibromatosis: Secondary | ICD-10-CM | POA: Diagnosis not present

## 2019-12-27 DIAGNOSIS — M722 Plantar fascial fibromatosis: Secondary | ICD-10-CM | POA: Diagnosis not present

## 2019-12-27 DIAGNOSIS — M19072 Primary osteoarthritis, left ankle and foot: Secondary | ICD-10-CM | POA: Diagnosis not present

## 2020-01-03 DIAGNOSIS — M19072 Primary osteoarthritis, left ankle and foot: Secondary | ICD-10-CM | POA: Diagnosis not present

## 2020-01-03 DIAGNOSIS — M722 Plantar fascial fibromatosis: Secondary | ICD-10-CM | POA: Diagnosis not present

## 2020-05-02 DIAGNOSIS — H40013 Open angle with borderline findings, low risk, bilateral: Secondary | ICD-10-CM | POA: Diagnosis not present

## 2020-05-23 DIAGNOSIS — M1712 Unilateral primary osteoarthritis, left knee: Secondary | ICD-10-CM | POA: Diagnosis not present

## 2020-05-23 DIAGNOSIS — M25462 Effusion, left knee: Secondary | ICD-10-CM | POA: Diagnosis not present

## 2020-05-30 DIAGNOSIS — M1712 Unilateral primary osteoarthritis, left knee: Secondary | ICD-10-CM | POA: Diagnosis not present

## 2020-06-06 DIAGNOSIS — N529 Male erectile dysfunction, unspecified: Secondary | ICD-10-CM | POA: Diagnosis not present

## 2020-06-06 DIAGNOSIS — R0981 Nasal congestion: Secondary | ICD-10-CM | POA: Diagnosis not present

## 2020-06-06 DIAGNOSIS — Z6841 Body Mass Index (BMI) 40.0 and over, adult: Secondary | ICD-10-CM | POA: Diagnosis not present

## 2020-06-06 DIAGNOSIS — E559 Vitamin D deficiency, unspecified: Secondary | ICD-10-CM | POA: Diagnosis not present

## 2020-06-06 DIAGNOSIS — I10 Essential (primary) hypertension: Secondary | ICD-10-CM | POA: Diagnosis not present

## 2020-06-06 DIAGNOSIS — M1712 Unilateral primary osteoarthritis, left knee: Secondary | ICD-10-CM | POA: Diagnosis not present

## 2020-06-11 DIAGNOSIS — E782 Mixed hyperlipidemia: Secondary | ICD-10-CM | POA: Diagnosis not present

## 2020-06-11 DIAGNOSIS — E039 Hypothyroidism, unspecified: Secondary | ICD-10-CM | POA: Diagnosis not present

## 2020-06-11 DIAGNOSIS — M1990 Unspecified osteoarthritis, unspecified site: Secondary | ICD-10-CM | POA: Diagnosis not present

## 2020-06-11 DIAGNOSIS — I10 Essential (primary) hypertension: Secondary | ICD-10-CM | POA: Diagnosis not present

## 2020-06-11 DIAGNOSIS — G8929 Other chronic pain: Secondary | ICD-10-CM | POA: Diagnosis not present

## 2020-06-20 DIAGNOSIS — M1712 Unilateral primary osteoarthritis, left knee: Secondary | ICD-10-CM | POA: Diagnosis not present

## 2020-07-18 DIAGNOSIS — R0602 Shortness of breath: Secondary | ICD-10-CM | POA: Diagnosis not present

## 2020-07-18 DIAGNOSIS — R52 Pain, unspecified: Secondary | ICD-10-CM | POA: Diagnosis not present

## 2020-07-18 DIAGNOSIS — U071 COVID-19: Secondary | ICD-10-CM | POA: Diagnosis not present

## 2020-07-18 DIAGNOSIS — R059 Cough, unspecified: Secondary | ICD-10-CM | POA: Diagnosis not present

## 2020-08-01 DIAGNOSIS — R0602 Shortness of breath: Secondary | ICD-10-CM | POA: Diagnosis not present

## 2020-08-01 DIAGNOSIS — U071 COVID-19: Secondary | ICD-10-CM | POA: Diagnosis not present

## 2020-08-28 DIAGNOSIS — G8929 Other chronic pain: Secondary | ICD-10-CM | POA: Diagnosis not present

## 2020-08-28 DIAGNOSIS — I10 Essential (primary) hypertension: Secondary | ICD-10-CM | POA: Diagnosis not present

## 2020-08-28 DIAGNOSIS — E039 Hypothyroidism, unspecified: Secondary | ICD-10-CM | POA: Diagnosis not present

## 2020-08-28 DIAGNOSIS — E782 Mixed hyperlipidemia: Secondary | ICD-10-CM | POA: Diagnosis not present

## 2020-08-28 DIAGNOSIS — M1990 Unspecified osteoarthritis, unspecified site: Secondary | ICD-10-CM | POA: Diagnosis not present

## 2020-10-10 DIAGNOSIS — E039 Hypothyroidism, unspecified: Secondary | ICD-10-CM | POA: Diagnosis not present

## 2020-10-10 DIAGNOSIS — E782 Mixed hyperlipidemia: Secondary | ICD-10-CM | POA: Diagnosis not present

## 2020-10-10 DIAGNOSIS — I10 Essential (primary) hypertension: Secondary | ICD-10-CM | POA: Diagnosis not present

## 2020-10-10 DIAGNOSIS — M1990 Unspecified osteoarthritis, unspecified site: Secondary | ICD-10-CM | POA: Diagnosis not present

## 2020-10-10 DIAGNOSIS — G8929 Other chronic pain: Secondary | ICD-10-CM | POA: Diagnosis not present

## 2020-11-21 DIAGNOSIS — M1712 Unilateral primary osteoarthritis, left knee: Secondary | ICD-10-CM | POA: Diagnosis not present

## 2020-11-27 DIAGNOSIS — M25462 Effusion, left knee: Secondary | ICD-10-CM | POA: Diagnosis not present

## 2020-11-27 DIAGNOSIS — M1712 Unilateral primary osteoarthritis, left knee: Secondary | ICD-10-CM | POA: Diagnosis not present

## 2020-11-27 DIAGNOSIS — M25562 Pain in left knee: Secondary | ICD-10-CM | POA: Diagnosis not present

## 2020-12-05 DIAGNOSIS — M25462 Effusion, left knee: Secondary | ICD-10-CM | POA: Diagnosis not present

## 2020-12-05 DIAGNOSIS — M1712 Unilateral primary osteoarthritis, left knee: Secondary | ICD-10-CM | POA: Diagnosis not present

## 2020-12-05 DIAGNOSIS — M25562 Pain in left knee: Secondary | ICD-10-CM | POA: Diagnosis not present

## 2020-12-12 DIAGNOSIS — M25462 Effusion, left knee: Secondary | ICD-10-CM | POA: Diagnosis not present

## 2020-12-12 DIAGNOSIS — M1712 Unilateral primary osteoarthritis, left knee: Secondary | ICD-10-CM | POA: Diagnosis not present

## 2020-12-12 DIAGNOSIS — M25562 Pain in left knee: Secondary | ICD-10-CM | POA: Diagnosis not present

## 2020-12-18 DIAGNOSIS — E782 Mixed hyperlipidemia: Secondary | ICD-10-CM | POA: Diagnosis not present

## 2020-12-18 DIAGNOSIS — G8929 Other chronic pain: Secondary | ICD-10-CM | POA: Diagnosis not present

## 2020-12-18 DIAGNOSIS — E039 Hypothyroidism, unspecified: Secondary | ICD-10-CM | POA: Diagnosis not present

## 2020-12-18 DIAGNOSIS — I10 Essential (primary) hypertension: Secondary | ICD-10-CM | POA: Diagnosis not present

## 2020-12-18 DIAGNOSIS — M1990 Unspecified osteoarthritis, unspecified site: Secondary | ICD-10-CM | POA: Diagnosis not present

## 2020-12-19 DIAGNOSIS — Z1211 Encounter for screening for malignant neoplasm of colon: Secondary | ICD-10-CM | POA: Diagnosis not present

## 2020-12-19 DIAGNOSIS — N529 Male erectile dysfunction, unspecified: Secondary | ICD-10-CM | POA: Diagnosis not present

## 2020-12-19 DIAGNOSIS — Z8616 Personal history of COVID-19: Secondary | ICD-10-CM | POA: Diagnosis not present

## 2020-12-19 DIAGNOSIS — R5383 Other fatigue: Secondary | ICD-10-CM | POA: Diagnosis not present

## 2020-12-19 DIAGNOSIS — Z0000 Encounter for general adult medical examination without abnormal findings: Secondary | ICD-10-CM | POA: Diagnosis not present

## 2020-12-19 DIAGNOSIS — E291 Testicular hypofunction: Secondary | ICD-10-CM | POA: Diagnosis not present

## 2020-12-19 DIAGNOSIS — Z125 Encounter for screening for malignant neoplasm of prostate: Secondary | ICD-10-CM | POA: Diagnosis not present

## 2020-12-19 DIAGNOSIS — E559 Vitamin D deficiency, unspecified: Secondary | ICD-10-CM | POA: Diagnosis not present

## 2020-12-19 DIAGNOSIS — R739 Hyperglycemia, unspecified: Secondary | ICD-10-CM | POA: Diagnosis not present

## 2020-12-19 DIAGNOSIS — E782 Mixed hyperlipidemia: Secondary | ICD-10-CM | POA: Diagnosis not present

## 2020-12-19 DIAGNOSIS — I10 Essential (primary) hypertension: Secondary | ICD-10-CM | POA: Diagnosis not present

## 2020-12-24 DIAGNOSIS — H02889 Meibomian gland dysfunction of unspecified eye, unspecified eyelid: Secondary | ICD-10-CM | POA: Diagnosis not present

## 2021-01-23 DIAGNOSIS — Z20822 Contact with and (suspected) exposure to covid-19: Secondary | ICD-10-CM | POA: Diagnosis not present

## 2021-01-23 DIAGNOSIS — M1712 Unilateral primary osteoarthritis, left knee: Secondary | ICD-10-CM | POA: Diagnosis not present

## 2021-02-13 DIAGNOSIS — H538 Other visual disturbances: Secondary | ICD-10-CM | POA: Diagnosis not present

## 2021-02-13 DIAGNOSIS — E039 Hypothyroidism, unspecified: Secondary | ICD-10-CM | POA: Diagnosis not present

## 2021-02-13 DIAGNOSIS — H35371 Puckering of macula, right eye: Secondary | ICD-10-CM | POA: Diagnosis not present

## 2021-02-13 DIAGNOSIS — H40013 Open angle with borderline findings, low risk, bilateral: Secondary | ICD-10-CM | POA: Diagnosis not present

## 2021-02-13 DIAGNOSIS — M1990 Unspecified osteoarthritis, unspecified site: Secondary | ICD-10-CM | POA: Diagnosis not present

## 2021-02-13 DIAGNOSIS — G8929 Other chronic pain: Secondary | ICD-10-CM | POA: Diagnosis not present

## 2021-02-13 DIAGNOSIS — H2513 Age-related nuclear cataract, bilateral: Secondary | ICD-10-CM | POA: Diagnosis not present

## 2021-02-13 DIAGNOSIS — E782 Mixed hyperlipidemia: Secondary | ICD-10-CM | POA: Diagnosis not present

## 2021-02-13 DIAGNOSIS — I10 Essential (primary) hypertension: Secondary | ICD-10-CM | POA: Diagnosis not present

## 2021-02-26 ENCOUNTER — Encounter: Payer: Self-pay | Admitting: Neurology

## 2021-02-27 ENCOUNTER — Ambulatory Visit (INDEPENDENT_AMBULATORY_CARE_PROVIDER_SITE_OTHER): Payer: Medicare Other | Admitting: Neurology

## 2021-02-27 ENCOUNTER — Encounter: Payer: Self-pay | Admitting: Neurology

## 2021-02-27 ENCOUNTER — Other Ambulatory Visit: Payer: Self-pay

## 2021-02-27 VITALS — BP 156/77 | HR 55 | Ht 71.0 in | Wt 297.0 lb

## 2021-02-27 DIAGNOSIS — U099 Post covid-19 condition, unspecified: Secondary | ICD-10-CM

## 2021-02-27 DIAGNOSIS — H25093 Other age-related incipient cataract, bilateral: Secondary | ICD-10-CM | POA: Diagnosis not present

## 2021-02-27 DIAGNOSIS — Z6841 Body Mass Index (BMI) 40.0 and over, adult: Secondary | ICD-10-CM

## 2021-02-27 DIAGNOSIS — R41840 Attention and concentration deficit: Secondary | ICD-10-CM | POA: Diagnosis not present

## 2021-02-27 DIAGNOSIS — H25811 Combined forms of age-related cataract, right eye: Secondary | ICD-10-CM | POA: Diagnosis not present

## 2021-02-27 DIAGNOSIS — Z7189 Other specified counseling: Secondary | ICD-10-CM

## 2021-02-27 DIAGNOSIS — H5203 Hypermetropia, bilateral: Secondary | ICD-10-CM | POA: Diagnosis not present

## 2021-02-27 DIAGNOSIS — G4731 Primary central sleep apnea: Secondary | ICD-10-CM

## 2021-02-27 DIAGNOSIS — H35371 Puckering of macula, right eye: Secondary | ICD-10-CM | POA: Diagnosis not present

## 2021-02-27 NOTE — Progress Notes (Signed)
SLEEP MEDICINE CLINIC    Provider:  Larey Seat, MD  Primary Care Physician:  [name], [place] [address]     Referring Provider: [name], [place],  [place] [id number]          Chief Complaint according to patient   Patient presents with:     New Patient (Initial Visit)           HISTORY OF PRESENT ILLNESS:  Craig Farrell is a 70 y.o. year old White or Caucasian male patient seen here as a referral on 02/27/2021 from Jillyn Ledger NP for a re-revaluation of sleep apnea .patient moved from [place] to [place] and then to [place] in 2018. He was seen in 2018 and 2019 by myself - complex sleep apnea. CSA. Diagnosed by [facility].   Chief concern according to patient :  I contracted COVID 19 twice 06-2018 and then again January 2022, still not vaccinated. He reports being treated with Caopectin, had SOB, weakness. He reports memory loss, has not seen sleep doc since he was last seen here. ASV user.    I have the pleasure of seeing Craig Farrell today, a right-handed Caucasian male with a possible sleep disorder.  She has a  has a past medical history of Allergic rhinitis, Arthritis, Blood in stool, Erectile dysfunction, diverticulitis of colon, Hypertension, Hypothyroidism, CSA on BIPAP, OBESITY Tubular adenoma of colon, UTI (urinary tract infection), Memory loss, COVID 19 - reportedly twice, Yellow jacket sting allergy.    Family medical /sleep history: has 3 older sisters, the eldest has memory loss at 25, no other family member on PAP with SA, father died with CHF and Alzheimer's type Dementia at 52.   Social history:  Patient is retired - semi and now Scientist, physiological! and lives in a household with spouse,  persons/ alone. Family status is  married , with one daughter 1 children, one grandchild.  The patient currently works. Pets are not  present. Tobacco use, never .  ETOH use; none , Caffeine intake in form of Coffee( 2  cups AM ) Soda( lunch some days) Tea ( /) or energy drinks. Regular exercise not- in form of yard work, .   Hobbies : none physical     Sleep habits are as follows: The patient's dinner time is between 6-7 PM. The patient goes to bed at 10.30 PM and continues to sleep for 7 hours, wakes  once  between 2-3 AM . The preferred sleep position is right sided , with the support of 2 pillows.  Dreams are reportedly infrequent.   6.15 AM is the usual rise time. The patient wakes up spontaneously.  He reports not feeling refreshed or restored in AM, with symptoms such as dry eyes, mouth.  Naps are taken infrequently.   Review of Systems: Out of a complete 14 system review, the patient complains of only the following symptoms, and all other reviewed systems are negative.:    COVID 19 second time with lasting memory effect.  Never lost smell and taste.    How likely are you to doze in the following situations: 0 = not likely, 1 = slight chance, 2 = moderate chance, 3 = high chance   Sitting and Reading? Watching Television? Sitting inactive in a public place (theater or meeting)? As a passenger in a car for an hour without a break? Lying down in the afternoon when circumstances permit? Sitting  and talking to someone? Sitting quietly after lunch without alcohol? In a car, while stopped for a few minutes in traffic?   Total = ASV user- 2/ 24 points   FSS endorsed at 29/ 63 points.    Craig Farrell ASV compliance is excellent :98-04-2021, 100% for days and hours with an average use at time of 7 hours and 41 minutes.  The expiratory pressure assistance is 4 cmH2O his minimum pressure support is 4 his maximum pressure support is 14 his residual AHI for central apnea is 0.2 which is remarkable.  95th percentile air leak is 11.6 he is using nasal pillows and unfortunately his nasal pillows are rather flimsy all the headgear is so he needs either to have a more frequent replacement of the headgear or  we switch him to a wisp which I will introduce him to too much today.  Social History   Socioeconomic History   Marital status: Married    Spouse name: Not on file   Number of children: 1   Years of education: 14   Highest education level: Not on file  Occupational History   Not on file  Tobacco Use   Smoking status: Never   Smokeless tobacco: Never  Vaping Use   Vaping Use: Never used  Substance and Sexual Activity   Alcohol use: No   Drug use: No   Sexual activity: Not on file  Other Topics Concern   Not on file  Social History Narrative   Married 1973. 1 child 59 in [place]- will be in [place] still. 23 month old grandbaby 04/2017.       3 years of college. 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       Two cups caffeine daily.      Right-handed.   Social Determinants of Health   Financial Resource Strain: Not on file  Food Insecurity: Not on file  Transportation Needs: Not on file  Physical Activity: Not on file  Stress: Not on file  Social Connections: Not on file    Family History  Problem Relation Age of Onset   Arthritis Mother    Colon cancer Mother        late 41s   Arthritis Sister    Arthritis Sister    Heart failure Father    Parkinson's disease Father    Dementia Father    Colon cancer Maternal Aunt    Healthy Child     Past Medical History:  Diagnosis Date   Allergic rhinitis    astelin   Arthritis    Hips and shoulders. celebrex '200mg'$  BID. plans to cut down with medicare costs   Blood in stool    augmentin   Erectile dysfunction    cialis '20mg'$    Hx of diverticulitis of colon    Hypertension    lisinopril '10mg'$    Hypothyroidism    synthroid 25 mcg   OSA on CPAP    Sleep apnea    ASV machine   Tubular adenoma of colon    2014- was told q3 years- needs update. likely adenoma   UTI (urinary tract infection)    x1- in hospital.    Yellow  jacket sting allergy    epi pens    Past Surgical History:  Procedure Laterality Date   HAND SURGERY Right    left foot surgery     fracture  related   left knee surgery     1980     Current Outpatient Medications on File Prior to Visit  Medication Sig Dispense Refill   acetaminophen (TYLENOL) 500 MG tablet Take by mouth as needed.     ASPIRIN 81 PO Take 81 mg by mouth daily.      Azelastine HCl 0.15 % SOLN SPRAY 2 SPRAY BY INTRANASAL ROUTE EVERY DAY IN EACH NOSTRIL 90 mL 1   celecoxib (CELEBREX) 200 MG capsule Take 1 capsule (200 mg total) by mouth daily. 90 capsule 0   Cholecalciferol 50 MCG (2000 UT) TABS Take 2,000 Units by mouth daily.      ipratropium (ATROVENT) 0.06 % nasal spray Place 2 sprays into both nostrils 4 (four) times daily. 15 mL 0   lisinopril (ZESTRIL) 10 MG tablet TAKE 1 TABLET BY MOUTH EVERY DAY 90 tablet 0   tadalafil (CIALIS) 20 MG tablet Take 1 tablet, orally, every 72 hours prn 30 tablet 2   atorvastatin (LIPITOR) 10 MG tablet Take 1 tablet (10 mg total) by mouth daily. (Patient not taking: No sig reported) 90 tablet 3   cyclobenzaprine (FLEXERIL) 5 MG tablet Take 1 tablet (5 mg total) by mouth at bedtime. (Patient not taking: Reported on 02/27/2021) 30 tablet 1   No current facility-administered medications on file prior to visit.    Allergies  Allergen Reactions   Aleve [Naproxen]     Pt reports "shakiness"    Yellow Jacket Venom [Bee Venom] Anaphylaxis   Zilretta [Triamcinolone Acetonide] Palpitations   Augmentin [Amoxicillin-Pot Clavulanate] Other (See Comments)    Bloody stool   Ibuprofen Other (See Comments)    skaking   Levaquin [Levofloxacin] Diarrhea   Oxycodone Nausea And Vomiting   Prednisone Other (See Comments)    Altered mental state   Sudafed [Pseudoephedrine] Rash   Vioxx [Rofecoxib] Rash    Physical exam:  Today's Vitals   02/27/21 0841  BP: (!) 156/77  Pulse: (!) 55  Weight: 297 lb (134.7 kg)  Height: '5\' 11"'$  (1.803 m)    Body mass index is 41.42 kg/m.   Wt Readings from Last 3 Encounters:  02/27/21 297 lb (134.7 kg)  08/31/18 287 lb 3.2 oz (130.3 kg)  08/18/18 284 lb 12.8 oz (129.2 kg)     Ht Readings from Last 3 Encounters:  02/27/21 '5\' 11"'$  (1.803 m)  08/31/18 '5\' 11"'$  (1.803 m)  08/18/18 '5\' 11"'$  (1.803 m)      General: The patient is awake, alert and appears not in acute distress. The patient is well groomed. Head: Normocephalic, atraumatic. Neck is supple. Mallampati 3 , peeked palate, crowded teeth.  neck circumference:22 inches . Nasal airflow patent.  Retrognathia is  seen.  Dental status: biological  Cardiovascular:  Regular rate and cardiac rhythm by pulse,  without distended neck veins. Respiratory: Lungs are clear to auscultation.  Skin:  Without evidence of ankle edema, or rash. Trunk: The patient's posture is erect.   Neurologic exam : The patient is awake and alert, oriented to place and time.   Memory subjective described as impaired- easier distracted/ algorithms( what do I need to do next)  .  Attention span & concentration ability appears normal.   Montreal Cognitive Assessment  02/27/2021  Visuospatial/ Executive (0/5) 4  Naming (0/3) 2  Attention: Read list of digits (0/2) 2  Attention: Read list of letters (0/1) 1  Attention: Serial 7 subtraction starting at 100 (0/3) 3  Language: Repeat phrase (0/2) 2  Language : Fluency (0/1) 1  Abstraction (0/2) 2  Delayed Recall (0/5) 3  Orientation (0/6) 6  Total 26   Impaired stereovision.   Speech is fluent,  without  dysarthria, dysphonia or aphasia.  Mood and affect are appropriate.   Cranial nerves: no loss of smell or taste reported  Pupils are equal and briskly reactive to light.   Funduscopic exam deferred.  He reports visual acuity in the right eye was abnormal with and without glasses.   Extraocular movements in vertical and horizontal planes were intact and without nystagmus. No Diplopia. Visual fields by finger  perimetry are intact. Hearing was intact to soft voice and finger rubbing.    Facial sensation intact to fine touch.  Facial motor strength is symmetric and tongue and uvula move midline.  Neck ROM : rotation, tilt and flexion extension were normal for age and shoulder shrug was symmetrical.    Motor exam:  Symmetric bulk, tone and ROM.   Normal tone without cog -wheeling, symmetric grip strength .   Sensory:  Fine touch, pinprick and vibration were tested  and  normal.  Proprioception tested in the upper extremities was normal.   Coordination: Rapid alternating movements in the fingers/hands were of normal speed.  The Finger-to-nose maneuver was intact without evidence of ataxia, dysmetria or tremor.   Gait and station: Patient could rise unassisted from a seated position, walked without assistive device.  Stance is of normal width/ base .Toe and heel walk were deferred.  Deep tendon reflexes: in the  upper and lower extremities are symmetric and intact.  Babinski response was deferred.    New problem in a patient not followed in 3 years.    After spending a total time of  50  minutes face to face and additional time for physical and neurologic examination, review of laboratory studies,  personal review of imaging studies, reports and results of other testing and review of referral information / records as far as provided in visit, I have established the following assessments:  1)  cognitive impairment comes with fatigue , and was noted after COVID , sec. Infection- MOCA 26/ 30 2)  right vision is poor, he has impaired stereo-vision.  3)  history of complex, central sleep apnea , doing well on ASV- machine is now 50.70 years old. He is considered PAP dependent. Nasal pillows. Let him try brevida.    My Plan is to proceed with:  1) prescribe BREVIDA mask. Repeat a ASV titration, he needs a new machine and may be new setting. 2) No need for MRI, no history of strokes. Eye disease is not  central vision problem.  3) Rv in 12 month with NP , may need new machine at that point.   I would like to thank [name], FNP  for allowing me to meet with and to take care of this pleasant patient.   In short, Craig Farrell is presenting with ASV dependent Complex sleep apnea, had twice COVID 19 , reportedly, and has reported vision impairment and memory loss.  I plan to follow up either personally or through our NP within 12 month.   CC: I will share my notes with PCP.  Electronically signed by: Larey Seat, MD 02/27/2021 9:14 AM  [facility] Board certified by The [employer] of Sleep Medicine and Diplomate of the [employer] of Sleep Medicine. Board certified In Neurology through the [employer], Fellow of the [employer] of Neurology. Medical  Director of [facility].

## 2021-03-19 DIAGNOSIS — H35371 Puckering of macula, right eye: Secondary | ICD-10-CM | POA: Diagnosis not present

## 2021-03-19 DIAGNOSIS — H43813 Vitreous degeneration, bilateral: Secondary | ICD-10-CM | POA: Diagnosis not present

## 2021-03-19 DIAGNOSIS — H43391 Other vitreous opacities, right eye: Secondary | ICD-10-CM | POA: Diagnosis not present

## 2021-03-19 DIAGNOSIS — H2513 Age-related nuclear cataract, bilateral: Secondary | ICD-10-CM | POA: Diagnosis not present

## 2021-04-10 DIAGNOSIS — H35371 Puckering of macula, right eye: Secondary | ICD-10-CM | POA: Diagnosis not present

## 2021-04-10 DIAGNOSIS — H43813 Vitreous degeneration, bilateral: Secondary | ICD-10-CM | POA: Diagnosis not present

## 2021-04-10 DIAGNOSIS — H43393 Other vitreous opacities, bilateral: Secondary | ICD-10-CM | POA: Diagnosis not present

## 2021-04-10 DIAGNOSIS — H25813 Combined forms of age-related cataract, bilateral: Secondary | ICD-10-CM | POA: Diagnosis not present

## 2021-04-10 DIAGNOSIS — H3540 Unspecified peripheral retinal degeneration: Secondary | ICD-10-CM | POA: Diagnosis not present

## 2021-04-17 DIAGNOSIS — H2513 Age-related nuclear cataract, bilateral: Secondary | ICD-10-CM | POA: Diagnosis not present

## 2021-04-17 DIAGNOSIS — H43813 Vitreous degeneration, bilateral: Secondary | ICD-10-CM | POA: Diagnosis not present

## 2021-04-17 DIAGNOSIS — H43393 Other vitreous opacities, bilateral: Secondary | ICD-10-CM | POA: Diagnosis not present

## 2021-04-17 DIAGNOSIS — H35371 Puckering of macula, right eye: Secondary | ICD-10-CM | POA: Diagnosis not present

## 2021-05-02 DIAGNOSIS — H18413 Arcus senilis, bilateral: Secondary | ICD-10-CM | POA: Diagnosis not present

## 2021-05-02 DIAGNOSIS — H25043 Posterior subcapsular polar age-related cataract, bilateral: Secondary | ICD-10-CM | POA: Diagnosis not present

## 2021-05-02 DIAGNOSIS — H2513 Age-related nuclear cataract, bilateral: Secondary | ICD-10-CM | POA: Diagnosis not present

## 2021-05-02 DIAGNOSIS — H2511 Age-related nuclear cataract, right eye: Secondary | ICD-10-CM | POA: Diagnosis not present

## 2021-05-02 DIAGNOSIS — H35371 Puckering of macula, right eye: Secondary | ICD-10-CM | POA: Diagnosis not present

## 2021-05-09 DIAGNOSIS — I10 Essential (primary) hypertension: Secondary | ICD-10-CM | POA: Diagnosis not present

## 2021-05-09 DIAGNOSIS — G8929 Other chronic pain: Secondary | ICD-10-CM | POA: Diagnosis not present

## 2021-05-09 DIAGNOSIS — E782 Mixed hyperlipidemia: Secondary | ICD-10-CM | POA: Diagnosis not present

## 2021-05-09 DIAGNOSIS — M1990 Unspecified osteoarthritis, unspecified site: Secondary | ICD-10-CM | POA: Diagnosis not present

## 2021-05-09 DIAGNOSIS — E039 Hypothyroidism, unspecified: Secondary | ICD-10-CM | POA: Diagnosis not present

## 2021-05-29 DIAGNOSIS — U071 COVID-19: Secondary | ICD-10-CM | POA: Diagnosis not present

## 2021-05-29 DIAGNOSIS — R059 Cough, unspecified: Secondary | ICD-10-CM | POA: Diagnosis not present

## 2021-05-31 DIAGNOSIS — R059 Cough, unspecified: Secondary | ICD-10-CM | POA: Diagnosis not present

## 2021-05-31 DIAGNOSIS — U071 COVID-19: Secondary | ICD-10-CM | POA: Diagnosis not present

## 2021-06-02 DIAGNOSIS — U071 COVID-19: Secondary | ICD-10-CM | POA: Diagnosis not present

## 2021-06-26 DIAGNOSIS — E291 Testicular hypofunction: Secondary | ICD-10-CM | POA: Diagnosis not present

## 2021-06-26 DIAGNOSIS — I10 Essential (primary) hypertension: Secondary | ICD-10-CM | POA: Diagnosis not present

## 2021-06-26 DIAGNOSIS — N529 Male erectile dysfunction, unspecified: Secondary | ICD-10-CM | POA: Diagnosis not present

## 2021-06-26 DIAGNOSIS — R059 Cough, unspecified: Secondary | ICD-10-CM | POA: Diagnosis not present

## 2021-06-26 DIAGNOSIS — Z23 Encounter for immunization: Secondary | ICD-10-CM | POA: Diagnosis not present

## 2021-06-26 DIAGNOSIS — Z8616 Personal history of COVID-19: Secondary | ICD-10-CM | POA: Diagnosis not present

## 2021-06-26 DIAGNOSIS — R7303 Prediabetes: Secondary | ICD-10-CM | POA: Diagnosis not present

## 2021-07-01 DIAGNOSIS — H2511 Age-related nuclear cataract, right eye: Secondary | ICD-10-CM | POA: Diagnosis not present

## 2021-07-01 DIAGNOSIS — H35371 Puckering of macula, right eye: Secondary | ICD-10-CM | POA: Diagnosis not present

## 2021-07-02 DIAGNOSIS — H35371 Puckering of macula, right eye: Secondary | ICD-10-CM | POA: Diagnosis not present

## 2021-07-09 DIAGNOSIS — H43812 Vitreous degeneration, left eye: Secondary | ICD-10-CM | POA: Diagnosis not present

## 2021-07-09 DIAGNOSIS — H35371 Puckering of macula, right eye: Secondary | ICD-10-CM | POA: Diagnosis not present

## 2021-07-21 DIAGNOSIS — Z20822 Contact with and (suspected) exposure to covid-19: Secondary | ICD-10-CM | POA: Diagnosis not present

## 2021-07-30 DIAGNOSIS — H2512 Age-related nuclear cataract, left eye: Secondary | ICD-10-CM | POA: Diagnosis not present

## 2021-07-31 DIAGNOSIS — H35371 Puckering of macula, right eye: Secondary | ICD-10-CM | POA: Diagnosis not present

## 2021-08-21 DIAGNOSIS — M25462 Effusion, left knee: Secondary | ICD-10-CM | POA: Diagnosis not present

## 2021-08-21 DIAGNOSIS — M1712 Unilateral primary osteoarthritis, left knee: Secondary | ICD-10-CM | POA: Diagnosis not present

## 2021-08-21 DIAGNOSIS — M65342 Trigger finger, left ring finger: Secondary | ICD-10-CM | POA: Diagnosis not present

## 2021-08-21 DIAGNOSIS — M25562 Pain in left knee: Secondary | ICD-10-CM | POA: Diagnosis not present

## 2021-08-23 DIAGNOSIS — H2512 Age-related nuclear cataract, left eye: Secondary | ICD-10-CM | POA: Diagnosis not present

## 2021-08-28 DIAGNOSIS — M25562 Pain in left knee: Secondary | ICD-10-CM | POA: Diagnosis not present

## 2021-08-28 DIAGNOSIS — M1712 Unilateral primary osteoarthritis, left knee: Secondary | ICD-10-CM | POA: Diagnosis not present

## 2021-08-28 DIAGNOSIS — M25462 Effusion, left knee: Secondary | ICD-10-CM | POA: Diagnosis not present

## 2021-08-28 DIAGNOSIS — M1A0620 Idiopathic chronic gout, left knee, without tophus (tophi): Secondary | ICD-10-CM | POA: Diagnosis not present

## 2021-08-28 DIAGNOSIS — M10062 Idiopathic gout, left knee: Secondary | ICD-10-CM | POA: Diagnosis not present

## 2021-09-06 DIAGNOSIS — M25562 Pain in left knee: Secondary | ICD-10-CM | POA: Diagnosis not present

## 2021-09-06 DIAGNOSIS — M1A0620 Idiopathic chronic gout, left knee, without tophus (tophi): Secondary | ICD-10-CM | POA: Diagnosis not present

## 2021-09-06 DIAGNOSIS — M25462 Effusion, left knee: Secondary | ICD-10-CM | POA: Diagnosis not present

## 2021-09-06 DIAGNOSIS — M1712 Unilateral primary osteoarthritis, left knee: Secondary | ICD-10-CM | POA: Diagnosis not present

## 2021-09-16 DIAGNOSIS — M25462 Effusion, left knee: Secondary | ICD-10-CM | POA: Diagnosis not present

## 2021-09-16 DIAGNOSIS — M1A0620 Idiopathic chronic gout, left knee, without tophus (tophi): Secondary | ICD-10-CM | POA: Diagnosis not present

## 2021-09-16 DIAGNOSIS — M1712 Unilateral primary osteoarthritis, left knee: Secondary | ICD-10-CM | POA: Diagnosis not present

## 2021-09-16 DIAGNOSIS — M25562 Pain in left knee: Secondary | ICD-10-CM | POA: Diagnosis not present

## 2021-10-03 DIAGNOSIS — Z20822 Contact with and (suspected) exposure to covid-19: Secondary | ICD-10-CM | POA: Diagnosis not present

## 2021-10-30 ENCOUNTER — Encounter: Payer: Self-pay | Admitting: Neurology

## 2021-10-30 ENCOUNTER — Ambulatory Visit (INDEPENDENT_AMBULATORY_CARE_PROVIDER_SITE_OTHER): Payer: Medicare Other | Admitting: Neurology

## 2021-10-30 VITALS — BP 132/80 | HR 58 | Ht 71.0 in | Wt 298.0 lb

## 2021-10-30 DIAGNOSIS — U099 Post covid-19 condition, unspecified: Secondary | ICD-10-CM

## 2021-10-30 DIAGNOSIS — R41840 Attention and concentration deficit: Secondary | ICD-10-CM | POA: Diagnosis not present

## 2021-10-30 DIAGNOSIS — G4731 Primary central sleep apnea: Secondary | ICD-10-CM | POA: Diagnosis not present

## 2021-10-30 DIAGNOSIS — Z7189 Other specified counseling: Secondary | ICD-10-CM | POA: Diagnosis not present

## 2021-10-30 DIAGNOSIS — Z6841 Body Mass Index (BMI) 40.0 and over, adult: Secondary | ICD-10-CM | POA: Diagnosis not present

## 2021-10-30 NOTE — Patient Instructions (Signed)
ASV dependent patient , needs new machine . Needs to switch DME - and needs a fitting for a new mask, was happiest with [facility].  ?

## 2021-10-30 NOTE — Progress Notes (Signed)
? ? ?SLEEP MEDICINE CLINIC ?  ? ?Provider:  Larey Seat, MD  ?Primary Care Physician:  [name], FNP ?[place] ?[place] [address]  ? ?  ?Referring Provider: [name], Fnp ?[place],  [place] [address]  ?  ?  ?    ?Chief Complaint according to patient   ?Patient presents with:  ?  ? New Patient (Initial Visit)  ?   Rm 10 alone. Here for f/u on ASV- . Reports he has been doing ok, has not been resting as well due to health issues his wife has been having.   ?  ?  ?HISTORY OF PRESENT ILLNESS:  ?Craig Farrell is a 71 y.o. year old White or Caucasian male patient seen here on 10/30/2021 in a RV. He had interval history , retinal surgery right eye , cataract surgery on both. Constant dry eye, left eye  feels always as if sand is under the lid.  ?Here for f/u on CPAP. Reports he has been doing ok, has not been resting as well due to health issues his wife has been having. Wife had influenza and has a pacemaker, ended up in CHF, for the third time.She has fatty liver. ?The patient is 100% compliant on his machine which was set up on July 16, 2016 so the machine is now 71 years old or heading towards.  This also means that the software may not be supportive much longer.  His AHI is 0.5/h which is a good resolution of apnea and he is a compliant user for 7 hours 39 minutes on average daily.   ?He is on an ASV machine : minimum pressure support of 4 cmH2O maximum pressure support 14 cmH2O and his expiratory pressure is 4 cm so I would like to replace his machine as soon as possible so that he does not get into any trouble.  I have to confirm with his Medicare provider that a baseline is requested or a study showing that he failed BiPAP and CPAP before or if I can actually go ahead and just replace the machine which is possible in some cases. ?His mask is a nasal pillow, he used previously [name] swift, now he is using a nasal pillow P10. Could he use a [name] again?  ?High air leak  is irritating eyes.  ? ? ? ? ? ? ? ? ? ? ?Originally from [place] Led[facility] NP for a re-revaluation of sleep apnea .This patient moved from [place] to [place]  state and then to [place] in 2018. ?He was seen in 2018 and 2019 by myself - complex sleep apnea. CSA. Diagnosed by [facility].  ? ?Chief concern according to patient :  "I contracted COVID 19 twice 06-2018 and then again January 2022, still not vaccinated. He reports being treated with Caopectin, had SOB, weakness. He reports memory loss, has not seen sleep doc since he was last seen here. ASV user. " ?  ?I have the pleasure of seeing Craig Farrell today, a right-handed Caucasian male with a possible sleep disorder.  She has a  has a past medical history of Allergic rhinitis, Arthritis, Blood in stool, Erectile dysfunction, diverticulitis of colon, Hypertension, Hypothyroidism, CSA on BIPAP, OBESITY Tubular adenoma of colon, UTI (urinary tract infection), Memory loss, COVID 19 - reportedly twice, Yellow jacket sting allergy. ?  ? Family medical /sleep history: has 3 older sisters, the eldest has memory loss at 17, no other family member on PAP  with SA, father died with CHF and Alzheimer's type Dementia at 60. ?  ?Social history:  Patient is retired - semi and now Scientist, physiological! and lives in a household with spouse,  persons/ alone. Family status is  married , with one daughter 54 children, one grandchild.  ?The patient currently works. ?Pets are not  present. ?Tobacco use, never .  ETOH use; none , Caffeine intake in form of Coffee( 2 cups AM ) Soda( lunch some days) Tea ( /) or energy drinks. ?Regular exercise not- in form of yard work, .   ?Hobbies : none physical  ? ?  ?Sleep habits are as follows: The patient's dinner time is between 6-7 PM. The patient goes to bed at 10.30 PM and continues to sleep for 7 hours, wakes  once  between 2-3 AM . ?The preferred sleep position is right sided , with the support of 2 pillows.  ?Dreams are reportedly infrequent.  ?  6.15 AM is the usual rise time. The patient wakes up spontaneously.  ?He reports not feeling refreshed or restored in AM, with symptoms such as dry eyes, mouth. ? Naps are taken infrequently. ?  ?Review of Systems: ?Out of a complete 14 system review, the patient complains of only the following symptoms, and all other reviewed systems are negative.:  ? ? ?COVID 19 second time with lasting memory effect.  ?Never lost smell and taste.  ?  ?How likely are you to doze in the following situations: ?0 = not likely, 1 = slight chance, 2 = moderate chance, 3 = high chance ?  ?Sitting and Reading? ?Watching Television? ?Sitting inactive in a public place (theater or meeting)? ?As a passenger in a car for an hour without a break? ?Lying down in the afternoon when circumstances permit? ?Sitting and talking to someone? ?Sitting quietly after lunch without alcohol? ?In a car, while stopped for a few minutes in traffic? ?  ?Total = ASV user- 4/ 24 points  ? FSS endorsed at 30/ 63 points.  ? ?ASV compliance is excellent :02-27-2021, ?100% for days and hours with an average use at time of 7 hours and 41 minutes.  The expiratory pressure assistance is 4 cmH2O his minimum pressure support is 4 his maximum pressure support is 14 his residual AHI for central apnea is 0.2 which is remarkable.  95th percentile air leak is 11.6 he is using nasal pillows and unfortunately his nasal pillows are rather flimsy all the headgear is so he needs either to have a more frequent replacement of the headgear or we switch him to a wisp which I will introduce him to too much today. ? ?Social History  ? ?Socioeconomic History  ? Marital status: Married  ?  Spouse name: Not on file  ? Number of children: 1  ? Years of education: 78  ? Highest education level: Not on file  ?Occupational History  ? Not on file  ?Tobacco Use  ? Smoking status: Never  ? Smokeless tobacco: Never  ?Vaping Use  ? Vaping Use: Never used  ?Substance and Sexual Activity  ? Alcohol  use: No  ? Drug use: No  ? Sexual activity: Not on file  ?Other Topics Concern  ? Not on file  ?Social History Narrative  ? Married 1973. 1 child 63 in [place]- will be in [place] still. 93 month old grandbaby 04/2017.   ?   ? 3 years of college. 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   ?   ? Two cups caffeine daily.  ?   ? Right-handed.  ? ?Social Determinants of Health  ? ?Financial Resource Strain: Not on file  ?Food Insecurity: Not on file  ?Transportation Needs: Not on file  ?Physical Activity: Not on file  ?Stress: Not on file  ?Social Connections: Not on file  ? ? ?Family History  ?Problem Relation Age of Onset  ? Arthritis Mother   ? Colon cancer Mother   ?     late 21s  ? Arthritis Sister   ? Arthritis Sister   ? Heart failure Father   ? Parkinson's disease Father   ? Dementia Father   ? Colon cancer Maternal Aunt   ? Healthy Child   ? ? ?Past Medical History:  ?Diagnosis Date  ? Allergic rhinitis   ? astelin  ? Arthritis   ? Hips and shoulders. celebrex '200mg'$  BID. plans to cut down with medicare costs  ? Blood in stool   ? augmentin  ? Erectile dysfunction   ? cialis '20mg'$   ? Hx of diverticulitis of colon   ? Hypertension   ? lisinopril '10mg'$   ? Hypothyroidism   ? synthroid 25 mcg  ? OSA on CPAP   ? Sleep apnea   ? ASV machine  ? Tubular adenoma of colon   ? 2014- was told q3 years- needs update. likely adenoma  ? UTI (urinary tract infection)   ? x1- in hospital.   ? Yellow jacket sting allergy   ? epi pens  ? ? ?Past Surgical History:  ?Procedure Laterality Date  ? HAND SURGERY Right   ? left foot surgery    ? fracture related  ? left knee surgery    ? 1980  ?  ? ?Current Outpatient Medications on File Prior to Visit  ?Medication Sig Dispense Refill  ? acetaminophen (TYLENOL) 500 MG tablet Take by mouth as needed.    ? ASPIRIN 81 PO Take 81 mg by mouth daily.     ? Azelastine HCl 0.15 % SOLN SPRAY 2 SPRAY BY  INTRANASAL ROUTE EVERY DAY IN EACH NOSTRIL 90 mL 1  ? celecoxib (CELEBREX) 200 MG capsule Take 1 capsule (200 mg total) by mouth daily. 90 capsule 0  ? Cholecalciferol 50 MCG (2000 UT) TABS Take 2,000 Units by

## 2021-10-31 ENCOUNTER — Telehealth: Payer: Self-pay

## 2021-10-31 NOTE — Telephone Encounter (Signed)
Called to schedule pt's sleep study but was unable to leave a message due to pt's vm being full ?

## 2021-11-04 ENCOUNTER — Telehealth: Payer: Self-pay

## 2021-11-04 NOTE — Telephone Encounter (Signed)
Called pt to schedule his sleep study but was unable to leave a message due to vm being full ?

## 2021-11-14 ENCOUNTER — Ambulatory Visit (INDEPENDENT_AMBULATORY_CARE_PROVIDER_SITE_OTHER): Payer: Medicare Other | Admitting: Neurology

## 2021-11-14 DIAGNOSIS — G4731 Primary central sleep apnea: Secondary | ICD-10-CM

## 2021-11-14 DIAGNOSIS — Z7189 Other specified counseling: Secondary | ICD-10-CM

## 2021-11-14 DIAGNOSIS — R41840 Attention and concentration deficit: Secondary | ICD-10-CM

## 2021-11-14 DIAGNOSIS — U099 Post covid-19 condition, unspecified: Secondary | ICD-10-CM

## 2021-11-22 DIAGNOSIS — Z20822 Contact with and (suspected) exposure to covid-19: Secondary | ICD-10-CM | POA: Diagnosis not present

## 2021-11-25 DIAGNOSIS — U099 Post covid-19 condition, unspecified: Secondary | ICD-10-CM | POA: Insufficient documentation

## 2021-11-25 DIAGNOSIS — Z7189 Other specified counseling: Secondary | ICD-10-CM | POA: Insufficient documentation

## 2021-11-25 MED ORDER — BELSOMRA 10 MG PO TABS: ORAL_TABLET | ORAL | 2 refills | Status: DC

## 2021-11-25 NOTE — Progress Notes (Signed)
PLANS/RECOMMENDATIONS: ?ASV at 14/ 5/4cm water was well tolerated, PLMs were reduced, and REM sleep was more prevalent under this setting. ?The mask used was an EVORA nasal cushion, in medium size, that served the patient well.  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 Consider Belsomra as a medication that doesn't affect the heart rhythm, is non-addictive, won't influence the oxygen saturation and can help to maintain sleep ? ? DISCUSSION: I will prescribe the above named settings for his new ASV machine, new mask and  ?Belsomra:   ?This patient has a pacemaker, bradycardia and central apnea and should not take tricyclic, trazodone, or benzodiazepines.

## 2021-11-25 NOTE — Procedures (Signed)
PATIENT'S NAME:  Craig, Farrell ?DOB:      04/09/1951      ?MR#:    [id number]     ?DATE OF RECORDING: 11/14/2021 M. Randall ?REFERRING M.D.:  Jillyn Ledger, FNP ?Study Performed:   ASV Titration ?HISTORY:   ?Craig Farrell is a 71 y.o.  Caucasian male patient and was seen on 10/30/2021 in a RV. He had new interval history information:, retinal surgery right eye , cataract surgery on both.  ?Constant dry eye, left eye feels always as if there is sand under the lid.  ?Here for f/u on CPAP. Reports he has been doing ok, has not been resting as well due to health issues his wife has been having. Wife had influenza and a pacemaker, ended up in CHF for the third time. She also has fatty liver. ?The patient is 100% compliant on his machine which was set up on July 16, 2016 so the machine is now 71 years old or heading towards.  ? This also means that the ASV's software may not be supported much longer.  His AHI is 0.5/h which is a good resolution of apnea and he is a compliant user with an average of  7 hours 39 minutes daily.   ?He is on an ASV machine : minimum pressure support of 4 cmH2O maximum pressure support 14 cmH2O and his expiratory pressure is 4 cm so I would like to replace his machine as soon as possible so that he does not get into any trouble. ? ?The patient endorsed the Epworth Sleepiness Scale at 4/24 points.   ?The patient's weight 298 pounds with a height of 71 (inches), resulting in a BMI of 41.7 kg/m2. ?The patient's neck circumference measured 22 inches. ? ?CURRENT MEDICATIONS: ASV, Tylenol, Azelastine HCI, Celebrex, Cholecalciferol, Atrovent, Zestril, Cialis,Lipitor ? ?  ?PROCEDURE:  This is a multichannel digital polysomnogram utilizing the SomnoStar 11.2 system.  Electrodes and sensors were applied and monitored per AASM Specifications.   EEG, EOG, Chin and Limb EMG, were sampled at 200 Hz.  ECG, Snore and Nasal Pressure, Thermal Airflow, Respiratory Effort, CPAP Flow and Pressure, Oximetry was sampled  at 50 Hz. Digital video and audio were recorded.     ? ? ?ASV was initiated at 4/14/4 cmH20 with heated humidity per AASM split night standards and pressure was advanced to ASV 5/15/4 cmH20 because of hypopneas, apneas and desaturations.  At a PAP pressure of 4/14/4 cmH20, there was a reduction of the AHI to 0 with improvement of sleep apnea. ? ?Lights Out was at 21:56 and Lights On at 05:02. Total recording time (TRT) was 426 minutes, with a total sleep time (TST) of 244 minutes. The patient's sleep latency was 39 minutes. REM latency was 96 minutes.  The sleep efficiency was poor at 57.3 %.   ? ?SLEEP ARCHITECTURE:  ?There were 47.5 minutes in Stage N1, 152.5 minutes Stage N2, 0 minutes Stage N3 and 44 minutes in Stage REM.  The percentage of Stage N1 was 19.5%, Stage N2 was 62.5%, Stage N3 was 0% and Stage R (REM sleep) was 18.%. The sleep architecture was notable for prolonged sleep interruption between 0.45 AM and 3 AM. WASO (Wake after sleep onset) was 144.5 minutes.   ? ?RESPIRATORY ANALYSIS:  There was a total of 0 respiratory events:     ?The total APNEA/HYPOPNEA INDEX  (AHI) was 0 /hour and the total RESPIRATORY DISTURBANCE INDEX was 0 /hour  0 events occurred in REM sleep and 0 events  in NREM. The REM AHI was 0 /hour versus a non-REM AHI of 0 /hour.  The patient spent 11 minutes of total sleep time in the supine position and 233 minutes in non-supine. The supine AHI was 0.0, versus a non-supine AHI of 0.0. ? ?OXYGEN SATURATION & C02:  The baseline 02 saturation was 96%, with the lowest being 84%. Time spent below 89% saturation equaled 7 minutes. ? ?The arousals were noted as: 31 were spontaneous, 17 were associated with PLMs, 0 were associated with respiratory events. ?The patient had a total of 115 Periodic Limb Movements. The Periodic Limb Movement (PLM) Arousal index was 4.2 /hour. ?EKG was in paced rhythm ?Audio and video analysis did not show any abnormal or unusual movements, behaviors, phonations  or vocalizations. No snoring was present.   ?The patient was fitted with a Medium N30 nasal cushion mask. ? ?DIAGNOSIS ?Central Sleep Apnea was no longer present under ASV of 4/14/.4 cm water with use of an EVORA nasal  cushion mask in medium. A pressure increase for the minimum pressure support from 4 to 5 cm water was seen after 2 hours , resulted in again AHI of 0 and did not improve sleep efficiency or nadir.  ?Sleep Related Hypoxemia was minimal, a total of 7 minutes of hypoxia were recorded. No snoring was present.  ?PLMs were clustered during the first setting of ASV and improved during the second part of the study.  ? ?PLANS/RECOMMENDATIONS: ?ASV at 14/ 5/4cm water was well tolerated, PLMs were reduced, and REM sleep was more prevalent under this setting. ?The mask used was an EVORA nasal cushion, in medium size, that served the patient well.  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 Consider Belsomra as a medication that doesn't affect the heart rhythm, is non-addictive, won't influence the oxygen saturation and can help to maintain sleep ? ? DISCUSSION: I will prescribe the above named settings for his new ASV machine, new mask and Belsomra.  ? ? ? ? ? ?A follow up appointment will be scheduled in the Sleep Clinic at [facility].   Please call [phone] with any questions.    ? ? ?I certify that I have reviewed the entire raw data recording prior to the issuance of this report in accordance with the Standards of Accreditation of the [place] [employer] (AASM) ? ? ? ? ? ?Larey Seat, M.D. ?[place], [place] Board Neurology  ?Diplomat, Tax adviser of Sleep Medicine ?Medical Director, [place] [facility] at [facility] ? ? ? ? ?

## 2021-11-25 NOTE — Addendum Note (Signed)
Addended by: Larey Seat on: 11/25/2021 04:38 PM ? ? Modules accepted: Orders ? ?

## 2021-11-26 ENCOUNTER — Encounter: Payer: Self-pay | Admitting: Neurology

## 2021-11-26 ENCOUNTER — Telehealth: Payer: Self-pay | Admitting: Neurology

## 2021-11-26 MED ORDER — TRAZODONE HCL 50 MG PO TABS: 25.0000 mg | ORAL_TABLET | Freq: Every evening | ORAL | 5 refills | Status: DC | PRN

## 2021-11-26 NOTE — Telephone Encounter (Signed)
Called [facility] and spoke w/ Jamar. Transferred to [place].  States no PA needed for Belsomra. Going through insurance ok. Cost: 304.00. ?

## 2021-11-26 NOTE — Telephone Encounter (Signed)
I called pt. I advised pt that Dr. Brett Fairy reviewed their sleep study results and found that pt was best treated with his ASV. Dr. Brett Fairy recommends that pt gets a new ASV machine. I reviewed PAP compliance expectations with the pt. Pt is agreeable to starting a new ASV. He would like to see about transferring care to another company. I advised pt that an order will be sent to a DME, [facility] to determine if they accept his insurance. If so, [facility] will call the pt within about one week after they file with the pt's insurance. [facility] will show the pt how to use the machine, fit for masks, and troubleshoot the ASV if needed. If [facility] doesn't accept than we will need to discuss next option for the patient. A follow up appt was made for insurance purposes with Dr. Brett Fairy on 02/12/22 at 3:30 pm. Pt verbalized understanding to arrive 15 minutes early and bring their CPAP. A letter with all of this information in it will be mailed to the pt as a reminder. I verified with the pt that the address we have on file is correct. Pt verbalized understanding of results. Pt had no questions at this time but was encouraged to call back if questions arise. I have sent the order to [facility] and have received confirmation that they have received the order. ? ?

## 2021-11-26 NOTE — Telephone Encounter (Signed)
-----   Message from Larey Seat, MD sent at 11/25/2021  4:35 PM EDT ----- ?PLANS/RECOMMENDATIONS: ?ASV at 14/ 5/4cm water was well tolerated, PLMs were reduced, and REM sleep was more prevalent under this setting. ?The mask used was an EVORA nasal cushion, in medium size, that served the patient well.  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 Consider Belsomra as a medication that doesn't affect the heart rhythm, is non-addictive, won't influence the oxygen saturation and can help to maintain sleep ? ? DISCUSSION: I will prescribe the above named settings for his new ASV machine, new mask and  ?Belsomra:   ?This patient has a pacemaker, bradycardia and central apnea and should not take tricyclic, trazodone, or benzodiazepines. ?

## 2021-12-09 DIAGNOSIS — M25562 Pain in left knee: Secondary | ICD-10-CM | POA: Diagnosis not present

## 2021-12-09 DIAGNOSIS — M1712 Unilateral primary osteoarthritis, left knee: Secondary | ICD-10-CM | POA: Diagnosis not present

## 2021-12-09 DIAGNOSIS — M1A0620 Idiopathic chronic gout, left knee, without tophus (tophi): Secondary | ICD-10-CM | POA: Diagnosis not present

## 2021-12-25 DIAGNOSIS — E291 Testicular hypofunction: Secondary | ICD-10-CM | POA: Diagnosis not present

## 2021-12-25 DIAGNOSIS — L989 Disorder of the skin and subcutaneous tissue, unspecified: Secondary | ICD-10-CM | POA: Diagnosis not present

## 2021-12-25 DIAGNOSIS — R7303 Prediabetes: Secondary | ICD-10-CM | POA: Diagnosis not present

## 2021-12-25 DIAGNOSIS — E782 Mixed hyperlipidemia: Secondary | ICD-10-CM | POA: Diagnosis not present

## 2021-12-25 DIAGNOSIS — Z0000 Encounter for general adult medical examination without abnormal findings: Secondary | ICD-10-CM | POA: Diagnosis not present

## 2021-12-25 DIAGNOSIS — I10 Essential (primary) hypertension: Secondary | ICD-10-CM | POA: Diagnosis not present

## 2021-12-25 DIAGNOSIS — E039 Hypothyroidism, unspecified: Secondary | ICD-10-CM | POA: Diagnosis not present

## 2021-12-25 DIAGNOSIS — J302 Other seasonal allergic rhinitis: Secondary | ICD-10-CM | POA: Diagnosis not present

## 2021-12-25 DIAGNOSIS — Z125 Encounter for screening for malignant neoplasm of prostate: Secondary | ICD-10-CM | POA: Diagnosis not present

## 2021-12-25 DIAGNOSIS — N529 Male erectile dysfunction, unspecified: Secondary | ICD-10-CM | POA: Diagnosis not present

## 2021-12-25 DIAGNOSIS — M109 Gout, unspecified: Secondary | ICD-10-CM | POA: Diagnosis not present

## 2021-12-25 DIAGNOSIS — G8929 Other chronic pain: Secondary | ICD-10-CM | POA: Diagnosis not present

## 2021-12-25 DIAGNOSIS — E559 Vitamin D deficiency, unspecified: Secondary | ICD-10-CM | POA: Diagnosis not present

## 2022-01-02 ENCOUNTER — Encounter: Payer: Self-pay | Admitting: Neurology

## 2022-01-02 NOTE — Telephone Encounter (Signed)
[facility] has responded to let us know that they will over ride and get this patient set up as long as he knows that if medicare audits a HST will have to be completed to show a baseline. Sent a message advising the patient of this along with [facility] was going to call and explain this to him as well.

## 2022-01-02 NOTE — Telephone Encounter (Signed)
Pt said, [facility] needs sleep study report as soon as possible. Pt would like a call from the nurse.

## 2022-01-02 NOTE — Telephone Encounter (Signed)
Called the patient back. We never had a copy of diagnostic SS. I sent [facility] all that I had. I called the pt to explain this. Advised that medicare guidelines require dg study along with ov notes.advised the patient I sent all I had. Pt baseline was 2010 and he said that office has closed and advised he doesn't have access to those records. I informed him that I understand but if this is what [facility] is having to have then I can't really do anything to faciliate this. I advised I would call [facility] and explain the situation and have them further investigate and see if there is anyway to move past.  Otherwise [facility] does indicate they have everything they would need to get him set up with a new ASV machine. They didn't indicate they would need anything else. Pt does not want to use them nor [facility].  He asked if Dr Brett Fairy was aware and advised I will make her aware but unfortunely there is nothing on her end that can be done to get medicare to cover to [facility].  Instructed this is a medicare issue not [facility]. Informed the pt I will be in touch once I have spoke with [facility].  I did contact management team with [facility] and informed her what all was going on and she is going to further investigate. Since [facility] is still a local company they have to follow medicare guidelines by the book to a tee to get reimbursed.

## 2022-01-22 ENCOUNTER — Telehealth: Payer: Self-pay | Admitting: Neurology

## 2022-01-22 NOTE — Telephone Encounter (Signed)
LVM for pt to call back to reschedule Initial ASV appt.

## 2022-01-23 ENCOUNTER — Telehealth: Payer: Self-pay | Admitting: Neurology

## 2022-01-23 NOTE — Telephone Encounter (Signed)
Pt was scheduled for Initial ASV visit on 03/20/2022.  Pt was informed to bring machine and power cord to appt.  [facility] P: [phone] F: [phone]  Set up on 01/07/22 w an AirCurve S10 ASV Pt to be scheduled between: 03/10/2022-04/09/22

## 2022-02-04 DIAGNOSIS — J0110 Acute frontal sinusitis, unspecified: Secondary | ICD-10-CM | POA: Diagnosis not present

## 2022-02-12 ENCOUNTER — Ambulatory Visit: Payer: PRIVATE HEALTH INSURANCE | Admitting: Neurology

## 2022-03-17 DIAGNOSIS — H26491 Other secondary cataract, right eye: Secondary | ICD-10-CM | POA: Diagnosis not present

## 2022-03-17 DIAGNOSIS — H35371 Puckering of macula, right eye: Secondary | ICD-10-CM | POA: Diagnosis not present

## 2022-03-17 DIAGNOSIS — Z961 Presence of intraocular lens: Secondary | ICD-10-CM | POA: Diagnosis not present

## 2022-03-17 DIAGNOSIS — H04123 Dry eye syndrome of bilateral lacrimal glands: Secondary | ICD-10-CM | POA: Diagnosis not present

## 2022-03-20 ENCOUNTER — Encounter: Payer: Self-pay | Admitting: Neurology

## 2022-03-20 ENCOUNTER — Ambulatory Visit (INDEPENDENT_AMBULATORY_CARE_PROVIDER_SITE_OTHER): Payer: Medicare Other | Admitting: Neurology

## 2022-03-20 VITALS — BP 158/72 | HR 66 | Ht 71.0 in | Wt 282.0 lb

## 2022-03-20 DIAGNOSIS — Z7189 Other specified counseling: Secondary | ICD-10-CM | POA: Diagnosis not present

## 2022-03-20 NOTE — Patient Instructions (Signed)

## 2022-03-20 NOTE — Progress Notes (Signed)
SLEEP MEDICINE CLINIC    Provider:  Larey Seat, MD  Primary Care Physician:  [name], [place] [address]     Referring Provider: [name], [place],  [place] [address]          Chief Complaint according to patient   Patient presents with:     New Patient (Initial Visit)     Rm 10 alone. Here for f/u on new ASV-machine  . Reports he has been doing ok, has not been resting as well due to health issues his wife has been having.       HISTORY OF PRESENT ILLNESS:  Craig Farrell is a 71 y.o. year old White or Caucasian male patient seen here on 03/20/2022 in a RV.  Works as a Doctor, general practice.  Presents for follow up with ASV. DME [facility]. 4th machine in the last 6 mths. about 2/3 am it starts making him feel like suffocating. He will wake up and go to bathroom and then when he lays down there is a noise clicking. States that he has had to change out machine multiple times.   He is now happy with the current humidity, ASV , setting: [facility]- service made him happy.  Mr. Vonada has actually gone through several machines he had a ongoing problem that between 2 and 3 AM machine started to make a clicking noise that woke him it seemed to revive from the humidifier chamber.  As he adjusted the humidity this became less and less frequent.  He had undergone a new sleep study on 11-14-2021.  The machine that it replaced was from December 2017.  AHI was 0.5 on the old settings he was highly compliant he used a minimum pressure support of 4 maximum of 14 and EPAP of 4 cm water.  The central apnea was no longer present under the settings he used on a [place] nasal cushion mask and medium size.  The resulting AHI was 0.  On the settings he has remained 100% compliant by days and time with an average use at time of 7 hours 48 minutes.  The AHI is 0.5.  He has a mild air leak.  I am very happy with symptomatic resolution of his sleep  apnea. Chin strap was tried, but he did not like it.     He had interval history , retinal surgery right eye , cataract surgery on both. Constant dry eye, left eye  feels always as if sand is under the lid.  Here for f/u on CPAP. Reports he has been doing ok, has not been resting as well due to health issues his wife has been having. Wife had influenza and has a pacemaker, ended up in CHF, for the third time.She has fatty liver. The patient is 100% compliant on his machine which was set up on July 16, 2016 so the machine is now 71 years old or heading towards.  This also means that the software may not be supportive much longer.  His AHI is 0.5/h which is a good resolution of apnea and he is a compliant user for 7 hours 39 minutes on average daily.   He is on an ASV machine : minimum pressure support of 4 cmH2O maximum pressure support 14 cmH2O and his expiratory pressure is 4 cm so I would like to replace his machine as soon as possible so that he does not get into any  trouble.  I have to confirm with his Medicare provider that a baseline is requested or a study showing that he failed BiPAP and CPAP before or if I can actually go ahead and just replace the machine which is possible in some cases. His mask is a nasal pillow, he used previously [name] swift, now he is using a nasal pillow P10. Could he use a [name] again? High air leak is irritating eyes.      Originally referred from Jillyn Ledger NP for a re-revaluation of sleep apnea . This patient moved from [place] to [place]  state and then to [place] in 2018. He was seen in 2018 and 2019 by myself - complex sleep apnea. CSA. Diagnosed by [facility].   Chief concern according to patient :  "I contracted COVID 19 twice 06-2018 and then again January 2022, still not vaccinated. He reports being treated with Caopectin, had SOB, weakness. He reports memory loss, has not seen sleep doc since he was last seen here. ASV user. "   I have the pleasure  of seeing Craig Farrell today, a right-handed Caucasian male with a possible sleep disorder.  She has a  has a past medical history of Allergic rhinitis, Arthritis, Blood in stool, Erectile dysfunction, diverticulitis of colon, Hypertension, Hypothyroidism, CSA on BIPAP, OBESITY Tubular adenoma of colon, UTI (urinary tract infection), Memory loss, COVID 19 - reportedly twice, Yellow jacket sting allergy.    Family medical /sleep history: has 3 older sisters, the eldest has memory loss at 38, no other family member on PAP with SA, father died with CHF and Alzheimer's type Dementia at 56.   Social history:  Patient is retired - semi and now Scientist, physiological! and lives in a household with spouse,  persons/ alone. Family status is  married , with one daughter 23 children, one grandchild.  The patient currently works. Pets are not  present. Tobacco use, never .  ETOH use; none , Caffeine intake in form of Coffee( 2 cups AM ) Soda( lunch some days) Tea ( /) or energy drinks. Regular exercise not- in form of yard work, .   Hobbies : none physical     Sleep habits are as follows: The patient's dinner time is between 6-7 PM. The patient goes to bed at 10.30 PM and continues to sleep for 7 hours, wakes  once  between 2-3 AM . The preferred sleep position is right sided , with the support of 2 pillows.  Dreams are reportedly infrequent.   6.15 AM is the usual rise time. The patient wakes up spontaneously.  He reports not feeling refreshed or restored in AM, with symptoms such as dry eyes, mouth.  Naps are taken infrequently.   Review of Systems: Out of a complete 14 system review, the patient complains of only the following symptoms, and all other reviewed systems are negative.:    COVID 19 second time with lasting memory effect.  Never lost smell and taste.    How likely are you to doze in the following situations: 0 = not likely, 1 = slight chance, 2 = moderate chance, 3 = high chance   Sitting  and Reading? Watching Television? Sitting inactive in a public place (theater or meeting)? As a passenger in a car for an hour without a break? Lying down in the afternoon when circumstances permit? Sitting and talking to someone? Sitting quietly after lunch without alcohol? In a car, while stopped for a few minutes in traffic?  Total = ASV user- 4/ 24 points   FSS endorsed at 30/ 63 points.   ASV compliance is excellent :02-27-2021, 100% for days and hours with an average use at time of 7 hours and 41 minutes.  The expiratory pressure assistance is 4 cmH2O his minimum pressure support is 4 his maximum pressure support is 14 his residual AHI for central apnea is 0.2 which is remarkable.  95th percentile air leak is 11.6 he is using nasal pillows and unfortunately his nasal pillows are rather flimsy all the headgear is so he needs either to have a more frequent replacement of the headgear or we switch him to a wisp which I will introduce him to too much today.  Social History   Socioeconomic History   Marital status: Married    Spouse name: Not on file   Number of children: 1   Years of education: 14   Highest education level: Not on file  Occupational History   Not on file  Tobacco Use   Smoking status: Never   Smokeless tobacco: Never  Vaping Use   Vaping Use: Never used  Substance and Sexual Activity   Alcohol use: No   Drug use: No   Sexual activity: Not on file  Other Topics Concern   Not on file  Social History Narrative   Married 1973. 1 child 40 in [place]- will be in [place] still. 25 month old grandbaby 04/2017.       3 years of college. 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       Two cups caffeine daily.      Right-handed.   Social Determinants of Health   Financial Resource Strain: Not on file  Food Insecurity: Not on file  Transportation Needs: Not on file   Physical Activity: Not on file  Stress: Not on file  Social Connections: Not on file    Family History  Problem Relation Age of Onset   Arthritis Mother    Colon cancer Mother        late 56s   Arthritis Sister    Arthritis Sister    Heart failure Father    Parkinson's disease Father    Dementia Father    Colon cancer Maternal Aunt    Healthy Child     Past Medical History:  Diagnosis Date   Allergic rhinitis    astelin   Arthritis    Hips and shoulders. celebrex '200mg'$  BID. plans to cut down with medicare costs   Blood in stool    augmentin   Erectile dysfunction    cialis '20mg'$    Hx of diverticulitis of colon    Hypertension    lisinopril '10mg'$    Hypothyroidism    synthroid 25 mcg   OSA on CPAP    Sleep apnea    ASV machine   Tubular adenoma of colon    2014- was told q3 years- needs update. likely adenoma   UTI (urinary tract infection)    x1- in hospital.    Yellow jacket sting allergy    epi pens    Past Surgical History:  Procedure Laterality Date   HAND SURGERY Right    left foot surgery     fracture related   left knee surgery     1980     Current Outpatient Medications on File Prior to Visit  Medication Sig Dispense  Refill   acetaminophen (TYLENOL) 500 MG tablet Take by mouth as needed.     allopurinol (ZYLOPRIM) 300 MG tablet Take 300 mg by mouth daily.     ASPIRIN 81 PO Take 81 mg by mouth daily.      Azelastine HCl 0.15 % SOLN SPRAY 2 SPRAY BY INTRANASAL ROUTE EVERY DAY IN EACH NOSTRIL 90 mL 1   celecoxib (CELEBREX) 200 MG capsule Take 1 capsule (200 mg total) by mouth daily. 90 capsule 0   Cholecalciferol 50 MCG (2000 UT) TABS Take 2,000 Units by mouth daily.      ipratropium (ATROVENT) 0.06 % nasal spray Place 2 sprays into both nostrils 4 (four) times daily. 15 mL 0   lisinopril (ZESTRIL) 10 MG tablet TAKE 1 TABLET BY MOUTH EVERY DAY (Patient taking differently: Take 20 mg by mouth daily.) 90 tablet 0   tadalafil (CIALIS) 20 MG tablet Take  1 tablet, orally, every 72 hours prn 30 tablet 2   atorvastatin (LIPITOR) 10 MG tablet Take 1 tablet (10 mg total) by mouth daily. (Patient not taking: Reported on 10/30/2021) 90 tablet 3   traZODone (DESYREL) 50 MG tablet Take 0.5-1 tablets (25-50 mg total) by mouth at bedtime as needed for sleep. (Patient not taking: Reported on 03/20/2022) 30 tablet 5   No current facility-administered medications on file prior to visit.    Allergies  Allergen Reactions   Aleve [Naproxen]     Pt reports "shakiness"    Yellow Jacket Venom [Bee Venom] Anaphylaxis   Zilretta [Triamcinolone Acetonide] Palpitations   Augmentin [Amoxicillin-Pot Clavulanate] Other (See Comments)    Bloody stool   Ibuprofen Other (See Comments)    skaking   Levaquin [Levofloxacin] Diarrhea   Oxycodone Nausea And Vomiting   Prednisone Other (See Comments)    Altered mental state   Sudafed [Pseudoephedrine] Rash   Vioxx [Rofecoxib] Rash    Physical exam:  Today's Vitals   03/20/22 0815  BP: (!) 158/72  Pulse: 66  Weight: 282 lb (127.9 kg)  Height: '5\' 11"'$  (1.803 m)   Body mass index is 39.33 kg/m.   Wt Readings from Last 3 Encounters:  03/20/22 282 lb (127.9 kg)  10/30/21 298 lb (135.2 kg)  02/27/21 297 lb (134.7 kg)     Ht Readings from Last 3 Encounters:  03/20/22 '5\' 11"'$  (1.803 m)  10/30/21 '5\' 11"'$  (1.803 m)  02/27/21 '5\' 11"'$  (1.803 m)      General: The patient is awake, alert and appears not in acute distress. The patient is well groomed. Head: Normocephalic, atraumatic. Neck is supple. Mallampati 3 , peeked palate, crowded teeth.  neck circumference:22 inches . Nasal airflow patent.  Retrognathia is  seen.  Dental status: biological  Cardiovascular:  Regular rate and cardiac rhythm by pulse,  without distended neck veins. Respiratory: Lungs are clear to auscultation.  Skin:  Without evidence of ankle edema, or rash. Trunk: The patient's posture is erect.   Neurologic exam : The patient is awake and  alert, oriented to place and time.   Memory subjective described as impaired- easier distracted/ algorithms( what do I need to do next)  .  Attention span & concentration ability appears normal.      02/27/2021    8:55 AM  Montreal Cognitive Assessment   Visuospatial/ Executive (0/5) 4  Naming (0/3) 2  Attention: Read list of digits (0/2) 2  Attention: Read list of letters (0/1) 1  Attention: Serial 7 subtraction starting at 100 (0/3) 3  Language: Repeat phrase (0/2) 2  Language : Fluency (0/1) 1  Abstraction (0/2) 2  Delayed Recall (0/5) 3  Orientation (0/6) 6  Total 26   Impaired stereovision.   Speech is fluent,  without  dysarthria, dysphonia or aphasia.  Mood and affect are appropriate.   Cranial nerves: no loss of smell or taste reported  Pupils are equal and briskly reactive to light.   Funduscopic exam deferred.  He reports visual acuity in the right eye was abnormal with and without glasses.   Extraocular movements in vertical and horizontal planes were intact and without nystagmus. No Diplopia. Visual fields by finger perimetry are intact. Hearing was intact to soft voice and finger rubbing.    Facial sensation intact to fine touch.  Facial motor strength is symmetric and tongue and uvula move midline.  Neck ROM : rotation, tilt and flexion extension were normal for age and shoulder shrug was symmetrical.    Motor exam:  Symmetric bulk, tone and ROM.   Normal tone without cog -wheeling, symmetric grip strength .   Sensory: intact  DTR: symmetric    After spending a total time of  25  minutes face to face and additional time for physical and neurologic examination, review of laboratory studies,  personal review of imaging studies, reports and results of other testing and review of referral information / records as far as provided in visit, I have established the following assessments:  1)  cognitive impairment comes with fatigue , and was noted after COVID ,  sec. Infection- MOCA 26/ 30.  2)  right vision is poor, he has impaired spatial /stereo-vision. Dry eye- may be due to air leak. There is no longer a high air leak on ASV.  Cataract surgery in 11/22 and 2/ 2023. Left with eye irritation- left corner of the left eye. 3)  history of complex, central sleep apnea , doing well on the new ASV- machine He is considered PAP dependent. Nasal pillows.     My Plan is to proceed with:  1) continue ASV use on current setting- 4 cm  through 14 cm pressure support/  EEP 4 cm  on new machine.    I would like to thank [name], FNP  for allowing me to meet with and to take care of this pleasant patient.   In short, Khayman Kirsch is presenting with ASV dependent Complex sleep apnea,  had twice COVID 19 , reportedly, and has reported vision impairment and memory loss.  I plan to follow up either personally or through our NP every 12 month.   CC: I will share my notes with PCP.  Electronically signed by: Larey Seat, MD 03/20/2022 8:39 AM  [facility] Board certified by The [employer] of Sleep Medicine and Diplomate of the [employer] of Sleep Medicine. Board certified In Neurology through the [facility], Fellow of the [employer] of Neurology. Medical Director of [facility].

## 2022-04-02 DIAGNOSIS — M25562 Pain in left knee: Secondary | ICD-10-CM | POA: Diagnosis not present

## 2022-04-02 DIAGNOSIS — M1A0620 Idiopathic chronic gout, left knee, without tophus (tophi): Secondary | ICD-10-CM | POA: Diagnosis not present

## 2022-04-02 DIAGNOSIS — M25462 Effusion, left knee: Secondary | ICD-10-CM | POA: Diagnosis not present

## 2022-04-02 DIAGNOSIS — M1712 Unilateral primary osteoarthritis, left knee: Secondary | ICD-10-CM | POA: Diagnosis not present

## 2022-04-09 DIAGNOSIS — M25462 Effusion, left knee: Secondary | ICD-10-CM | POA: Diagnosis not present

## 2022-04-09 DIAGNOSIS — M1A0620 Idiopathic chronic gout, left knee, without tophus (tophi): Secondary | ICD-10-CM | POA: Diagnosis not present

## 2022-04-09 DIAGNOSIS — M1712 Unilateral primary osteoarthritis, left knee: Secondary | ICD-10-CM | POA: Diagnosis not present

## 2022-04-09 DIAGNOSIS — M25562 Pain in left knee: Secondary | ICD-10-CM | POA: Diagnosis not present

## 2022-04-11 DIAGNOSIS — B078 Other viral warts: Secondary | ICD-10-CM | POA: Diagnosis not present

## 2022-04-11 DIAGNOSIS — H61001 Unspecified perichondritis of right external ear: Secondary | ICD-10-CM | POA: Diagnosis not present

## 2022-04-11 DIAGNOSIS — L820 Inflamed seborrheic keratosis: Secondary | ICD-10-CM | POA: Diagnosis not present

## 2022-04-15 NOTE — Progress Notes (Signed)
100% compliance on ASV- AHI of 0.5/h EXCELLENT !

## 2022-04-16 DIAGNOSIS — M25462 Effusion, left knee: Secondary | ICD-10-CM | POA: Diagnosis not present

## 2022-04-16 DIAGNOSIS — M25562 Pain in left knee: Secondary | ICD-10-CM | POA: Diagnosis not present

## 2022-04-16 DIAGNOSIS — M1712 Unilateral primary osteoarthritis, left knee: Secondary | ICD-10-CM | POA: Diagnosis not present

## 2022-04-16 DIAGNOSIS — M1A0620 Idiopathic chronic gout, left knee, without tophus (tophi): Secondary | ICD-10-CM | POA: Diagnosis not present

## 2022-04-23 DIAGNOSIS — M1712 Unilateral primary osteoarthritis, left knee: Secondary | ICD-10-CM | POA: Diagnosis not present

## 2022-04-23 DIAGNOSIS — M25462 Effusion, left knee: Secondary | ICD-10-CM | POA: Diagnosis not present

## 2022-04-23 DIAGNOSIS — M25562 Pain in left knee: Secondary | ICD-10-CM | POA: Diagnosis not present

## 2022-05-15 DIAGNOSIS — H26491 Other secondary cataract, right eye: Secondary | ICD-10-CM | POA: Diagnosis not present

## 2022-05-20 DIAGNOSIS — M109 Gout, unspecified: Secondary | ICD-10-CM | POA: Diagnosis not present

## 2022-05-20 DIAGNOSIS — E291 Testicular hypofunction: Secondary | ICD-10-CM | POA: Diagnosis not present

## 2022-06-03 DIAGNOSIS — H16223 Keratoconjunctivitis sicca, not specified as Sjogren's, bilateral: Secondary | ICD-10-CM | POA: Diagnosis not present

## 2022-06-03 DIAGNOSIS — Z961 Presence of intraocular lens: Secondary | ICD-10-CM | POA: Diagnosis not present

## 2022-06-03 DIAGNOSIS — H26492 Other secondary cataract, left eye: Secondary | ICD-10-CM | POA: Diagnosis not present

## 2022-06-25 DIAGNOSIS — E782 Mixed hyperlipidemia: Secondary | ICD-10-CM | POA: Diagnosis not present

## 2022-06-25 DIAGNOSIS — I10 Essential (primary) hypertension: Secondary | ICD-10-CM | POA: Diagnosis not present

## 2022-06-25 DIAGNOSIS — M109 Gout, unspecified: Secondary | ICD-10-CM | POA: Diagnosis not present

## 2022-06-25 DIAGNOSIS — E039 Hypothyroidism, unspecified: Secondary | ICD-10-CM | POA: Diagnosis not present

## 2022-06-25 DIAGNOSIS — E291 Testicular hypofunction: Secondary | ICD-10-CM | POA: Diagnosis not present

## 2022-06-25 DIAGNOSIS — J320 Chronic maxillary sinusitis: Secondary | ICD-10-CM | POA: Diagnosis not present

## 2022-06-25 DIAGNOSIS — N529 Male erectile dysfunction, unspecified: Secondary | ICD-10-CM | POA: Diagnosis not present

## 2022-06-25 DIAGNOSIS — R7303 Prediabetes: Secondary | ICD-10-CM | POA: Diagnosis not present

## 2022-06-25 DIAGNOSIS — Z6841 Body Mass Index (BMI) 40.0 and over, adult: Secondary | ICD-10-CM | POA: Diagnosis not present

## 2022-09-17 DIAGNOSIS — Z961 Presence of intraocular lens: Secondary | ICD-10-CM | POA: Diagnosis not present

## 2022-09-17 DIAGNOSIS — Z9889 Other specified postprocedural states: Secondary | ICD-10-CM | POA: Diagnosis not present

## 2022-09-17 DIAGNOSIS — H04123 Dry eye syndrome of bilateral lacrimal glands: Secondary | ICD-10-CM | POA: Diagnosis not present

## 2022-09-17 DIAGNOSIS — G4733 Obstructive sleep apnea (adult) (pediatric): Secondary | ICD-10-CM | POA: Diagnosis not present

## 2022-09-17 DIAGNOSIS — H43812 Vitreous degeneration, left eye: Secondary | ICD-10-CM | POA: Diagnosis not present

## 2022-09-17 DIAGNOSIS — H40013 Open angle with borderline findings, low risk, bilateral: Secondary | ICD-10-CM | POA: Diagnosis not present

## 2022-09-17 DIAGNOSIS — H538 Other visual disturbances: Secondary | ICD-10-CM | POA: Diagnosis not present

## 2022-10-07 DIAGNOSIS — H538 Other visual disturbances: Secondary | ICD-10-CM | POA: Diagnosis not present

## 2022-10-07 DIAGNOSIS — H40013 Open angle with borderline findings, low risk, bilateral: Secondary | ICD-10-CM | POA: Diagnosis not present

## 2022-10-29 DIAGNOSIS — R0981 Nasal congestion: Secondary | ICD-10-CM | POA: Diagnosis not present

## 2022-10-29 DIAGNOSIS — R059 Cough, unspecified: Secondary | ICD-10-CM | POA: Diagnosis not present

## 2022-10-29 DIAGNOSIS — J40 Bronchitis, not specified as acute or chronic: Secondary | ICD-10-CM | POA: Diagnosis not present

## 2022-10-29 DIAGNOSIS — J329 Chronic sinusitis, unspecified: Secondary | ICD-10-CM | POA: Diagnosis not present

## 2022-11-05 DIAGNOSIS — M1712 Unilateral primary osteoarthritis, left knee: Secondary | ICD-10-CM | POA: Diagnosis not present

## 2022-11-05 DIAGNOSIS — M25462 Effusion, left knee: Secondary | ICD-10-CM | POA: Diagnosis not present

## 2022-11-05 DIAGNOSIS — M1A0620 Idiopathic chronic gout, left knee, without tophus (tophi): Secondary | ICD-10-CM | POA: Diagnosis not present

## 2022-11-05 DIAGNOSIS — M25562 Pain in left knee: Secondary | ICD-10-CM | POA: Diagnosis not present

## 2022-11-06 DIAGNOSIS — I10 Essential (primary) hypertension: Secondary | ICD-10-CM | POA: Diagnosis not present

## 2022-11-06 DIAGNOSIS — M109 Gout, unspecified: Secondary | ICD-10-CM | POA: Diagnosis not present

## 2022-11-06 DIAGNOSIS — H6690 Otitis media, unspecified, unspecified ear: Secondary | ICD-10-CM | POA: Diagnosis not present

## 2022-11-06 DIAGNOSIS — E291 Testicular hypofunction: Secondary | ICD-10-CM | POA: Diagnosis not present

## 2022-11-06 DIAGNOSIS — R059 Cough, unspecified: Secondary | ICD-10-CM | POA: Diagnosis not present

## 2022-11-06 DIAGNOSIS — R0602 Shortness of breath: Secondary | ICD-10-CM | POA: Diagnosis not present

## 2022-11-06 DIAGNOSIS — N529 Male erectile dysfunction, unspecified: Secondary | ICD-10-CM | POA: Diagnosis not present

## 2022-11-12 DIAGNOSIS — M25462 Effusion, left knee: Secondary | ICD-10-CM | POA: Diagnosis not present

## 2022-11-12 DIAGNOSIS — M1712 Unilateral primary osteoarthritis, left knee: Secondary | ICD-10-CM | POA: Diagnosis not present

## 2022-11-19 ENCOUNTER — Encounter: Payer: Self-pay | Admitting: Gastroenterology

## 2022-11-19 DIAGNOSIS — M25462 Effusion, left knee: Secondary | ICD-10-CM | POA: Diagnosis not present

## 2022-11-19 DIAGNOSIS — M25562 Pain in left knee: Secondary | ICD-10-CM | POA: Diagnosis not present

## 2022-11-19 DIAGNOSIS — M1712 Unilateral primary osteoarthritis, left knee: Secondary | ICD-10-CM | POA: Diagnosis not present

## 2022-11-26 ENCOUNTER — Other Ambulatory Visit: Payer: Self-pay | Admitting: Sports Medicine

## 2022-11-26 ENCOUNTER — Ambulatory Visit
Admission: RE | Admit: 2022-11-26 | Discharge: 2022-11-26 | Disposition: A | Discharge status: Home / Self Care | Payer: Medicare Other | Source: Ambulatory Visit | Attending: Sports Medicine | Admitting: Sports Medicine

## 2022-11-26 DIAGNOSIS — G8929 Other chronic pain: Secondary | ICD-10-CM

## 2022-11-26 DIAGNOSIS — M25462 Effusion, left knee: Secondary | ICD-10-CM | POA: Diagnosis not present

## 2022-11-26 DIAGNOSIS — M545 Low back pain, unspecified: Secondary | ICD-10-CM | POA: Diagnosis not present

## 2022-11-26 DIAGNOSIS — M1712 Unilateral primary osteoarthritis, left knee: Secondary | ICD-10-CM | POA: Diagnosis not present

## 2022-12-16 DIAGNOSIS — M9905 Segmental and somatic dysfunction of pelvic region: Secondary | ICD-10-CM | POA: Diagnosis not present

## 2022-12-16 DIAGNOSIS — M5106 Intervertebral disc disorders with myelopathy, lumbar region: Secondary | ICD-10-CM | POA: Diagnosis not present

## 2022-12-16 DIAGNOSIS — M9906 Segmental and somatic dysfunction of lower extremity: Secondary | ICD-10-CM | POA: Diagnosis not present

## 2022-12-16 DIAGNOSIS — M5134 Other intervertebral disc degeneration, thoracic region: Secondary | ICD-10-CM | POA: Diagnosis not present

## 2022-12-16 DIAGNOSIS — M9903 Segmental and somatic dysfunction of lumbar region: Secondary | ICD-10-CM | POA: Diagnosis not present

## 2022-12-31 DIAGNOSIS — I10 Essential (primary) hypertension: Secondary | ICD-10-CM | POA: Diagnosis not present

## 2022-12-31 DIAGNOSIS — Z0000 Encounter for general adult medical examination without abnormal findings: Secondary | ICD-10-CM | POA: Diagnosis not present

## 2022-12-31 DIAGNOSIS — E291 Testicular hypofunction: Secondary | ICD-10-CM | POA: Diagnosis not present

## 2022-12-31 DIAGNOSIS — Z1211 Encounter for screening for malignant neoplasm of colon: Secondary | ICD-10-CM | POA: Diagnosis not present

## 2022-12-31 DIAGNOSIS — E559 Vitamin D deficiency, unspecified: Secondary | ICD-10-CM | POA: Diagnosis not present

## 2022-12-31 DIAGNOSIS — N529 Male erectile dysfunction, unspecified: Secondary | ICD-10-CM | POA: Diagnosis not present

## 2022-12-31 DIAGNOSIS — R7303 Prediabetes: Secondary | ICD-10-CM | POA: Diagnosis not present

## 2022-12-31 DIAGNOSIS — M1990 Unspecified osteoarthritis, unspecified site: Secondary | ICD-10-CM | POA: Diagnosis not present

## 2022-12-31 DIAGNOSIS — J302 Other seasonal allergic rhinitis: Secondary | ICD-10-CM | POA: Diagnosis not present

## 2022-12-31 DIAGNOSIS — E782 Mixed hyperlipidemia: Secondary | ICD-10-CM | POA: Diagnosis not present

## 2022-12-31 DIAGNOSIS — M109 Gout, unspecified: Secondary | ICD-10-CM | POA: Diagnosis not present

## 2022-12-31 DIAGNOSIS — E039 Hypothyroidism, unspecified: Secondary | ICD-10-CM | POA: Diagnosis not present

## 2023-03-17 DIAGNOSIS — Z09 Encounter for follow-up examination after completed treatment for conditions other than malignant neoplasm: Secondary | ICD-10-CM | POA: Diagnosis not present

## 2023-03-17 DIAGNOSIS — D123 Benign neoplasm of transverse colon: Secondary | ICD-10-CM | POA: Diagnosis not present

## 2023-03-17 DIAGNOSIS — Z86010 Personal history of colonic polyps: Secondary | ICD-10-CM | POA: Diagnosis not present

## 2023-03-19 DIAGNOSIS — D123 Benign neoplasm of transverse colon: Secondary | ICD-10-CM | POA: Diagnosis not present

## 2023-03-25 ENCOUNTER — Encounter: Payer: Self-pay | Admitting: Neurology

## 2023-03-26 ENCOUNTER — Telehealth: Payer: Self-pay | Admitting: Neurology

## 2023-03-26 ENCOUNTER — Ambulatory Visit: Payer: Medicare Other | Admitting: Neurology

## 2023-03-26 ENCOUNTER — Encounter: Payer: Self-pay | Admitting: Neurology

## 2023-03-26 ENCOUNTER — Ambulatory Visit (INDEPENDENT_AMBULATORY_CARE_PROVIDER_SITE_OTHER): Payer: Medicare Other | Admitting: Neurology

## 2023-03-26 VITALS — BP 144/52 | HR 42 | Ht 71.0 in | Wt 290.0 lb

## 2023-03-26 DIAGNOSIS — G4731 Primary central sleep apnea: Secondary | ICD-10-CM | POA: Diagnosis not present

## 2023-03-26 DIAGNOSIS — G4739 Other sleep apnea: Secondary | ICD-10-CM

## 2023-03-26 DIAGNOSIS — Z7189 Other specified counseling: Secondary | ICD-10-CM

## 2023-03-26 NOTE — Progress Notes (Signed)
Provider:  Melvyn Novas, MD  Primary Care Physician:  Soundra Pilon, FNP [address] [place] [address]     Referring Provider: Soundra Pilon, Fnp [address] [place],  [place] [address]          Chief Complaint according to patient   Patient presents with:     Sleep Patient on ASV            HISTORY OF PRESENT ILLNESS:  03-26-2023,  Craig Farrell is a 72 y.o. male patient who is self-employed- here for revisit 03/26/2023 for yearly ASV follow up..  Chief concern according to patient :  Here reporting hip pain, possible bursitis, affecting sleep only seldomly. Glaucoma, dry eye, his wife has been very sick, has gotten her 4 th pacemaker, was hospitalized  just  the for fourth time this year- and got a multi-lead pacemaker. He has been out of work for [employer].    His current ASV is a new machine , the fourth model, third replacement.  All have produced some noise after several hours of quiet running.- previously reported. The water chamber is not empty, there was no large air leak, his last model but replaced a previous ASV machine has again started just now to make this noise.  He was able to demonstrate the noises to the DME technologist, who was surprised and had never heard these before.  Apparently the machine may run 5 to 7 hours without making these noises.  I cannot explain why this would happen.  The DME has exchanged the machines from different lots and different serial numbers and yet has not been able to find a resolution.  [name] is a very compliant ASV use at 100% of days and 100% of hours with an average of 8 hours 13 minutes his expiratory pressure relief is set at 4 cm water his minimum pressure support is 4 and maximum pressure support 15 cm water.  His AHI is 0.8 which is a dream.  REDSMED N 20  /nasal cradle- He does have an air leak of 17.7 L a minute this is the 95th percentile and is rather high.  He does have facial hair which can  interfere but he has used nasal pillows that should seal easier.         Seen here on 03/20/2022 in a RV.  Works as a Psychologist, counselling.  Presents for follow up with ASV. DME [facility]. 4th machine in the last 6 mths. about 2/3 am it starts making him feel like suffocating. He will wake up and go to bathroom and then when he lays down there is a noise clicking. States that he has had to change out machine multiple times.    He is now happy with the current humidity, ASV , setting: [facility] DME- service made him happy.  [name] has actually gone through several machines he had a ongoing problem that between 2 and 3 AM machine started to make a clicking noise that woke him it seemed to revive from the humidifier chamber.  As he adjusted the humidity this became less and less frequent.  He had undergone a new sleep study on 11-14-2021.  The machine that it replaced was from December 2017.  AHI was 0.5 on the old settings he was highly compliant he used a minimum pressure support of 4 maximum of 14 and EPAP of 4 cm water.  The central apnea  was no longer present under the settings he used on a Voora nasal cushion mask and medium size.  The resulting AHI was 0.  On the settings he has remained 100% compliant by days and time with an average use at time of 7 hours 48 minutes.  The AHI is 0.5.  He has a mild air leak.  I am very happy with symptomatic resolution of his sleep apnea. Chin strap was tried, but he did not like it.  His AHI is 0.5/h which is a good resolution of apnea and he is a compliant user for 7 hours 39 minutes on average daily.   He is on an ASV machine : minimum pressure support of 4 cmH2O maximum pressure support 14 cmH2O and his expiratory pressure is 4 cm so I would like to replace his machine as soon as possible so that he does not get into any trouble.      He had interval history : a retinal surgery right eye , had cataract surgery on both.  Constant dry eye, left eye feels always  as if sand is in his eyes.   Review of Systems: Out of a complete 14 system review, the patient complains of only the following symptoms, and all other reviewed systems are negative.:     How likely are you to doze in the following situations: 0 = not likely, 1 = slight chance, 2 = moderate chance, 3 = high chance   Sitting and Reading? Watching Television? Sitting inactive in a public place (theater or meeting)? As a passenger in a car for an hour without a break? Lying down in the afternoon when circumstances permit? Sitting and talking to someone? Sitting quietly after lunch without alcohol? In a car, while stopped for a few minutes in traffic?   FSS at 28/ 63 points, ESS at 2/ 24, and GDS at 0/ 15 points.    Social History   Socioeconomic History   Marital status: Married    Spouse name: Not on file   Number of children: 1   Years of education: 14   Highest education level: Not on file  Occupational History   Not on file  Tobacco Use   Smoking status: Never   Smokeless tobacco: Never  Vaping Use   Vaping status: Never Used  Substance and Sexual Activity   Alcohol use: No   Drug use: No   Sexual activity: Not on file  Other Topics Concern   Not on file  Social History Narrative   Married 1973. 1 child 52 in [place]- will be in [place] still. 60 month old grandbaby 04/2017.       3 years of college.  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       Two cups caffeine daily.      Right-handed.   Social Determinants of Health   Financial Resource Strain: Not on file  Food Insecurity: No Food Insecurity (01/23/2021)   Received from [facility]   Hunger Vital Sign    Worried About Running Out of Food in the Last Year: Never true    Ran Out of Food in the Last Year: Never true  Transportation Needs: Not on file  Physical Activity: Not on file  Stress: Not on file   Social Connections: Unknown (12/03/2021)   [facility]   Social Network  Social Network: Not on file    Family History  Problem Relation Age of Onset   Arthritis Mother    Colon cancer Mother        late 16s   Arthritis Sister    Arthritis Sister    Heart failure Father    Parkinson's disease Father    Dementia Father    Colon cancer Maternal Aunt    Healthy Child     Past Medical History:  Diagnosis Date   Allergic rhinitis    astelin   Arthritis    Hips and shoulders. celebrex 200mg  BID. plans to cut down with medicare costs   Blood in stool    augmentin   Erectile dysfunction    cialis 20mg    Hx of diverticulitis of colon    Hypertension    lisinopril 10mg    OSA on CPAP    Sleep apnea    ASV machine   Tubular adenoma of colon    2014- was told q3 years- needs update. likely adenoma   UTI (urinary tract infection)    x1- in hospital.    Yellow jacket sting allergy    epi pens    Past Surgical History:  Procedure Laterality Date   HAND SURGERY Right    left foot surgery     fracture related   left knee surgery     1980     Current Outpatient Medications on File Prior to Visit  Medication Sig Dispense Refill   acetaminophen (TYLENOL) 500 MG tablet Take by mouth as needed.     allopurinol (ZYLOPRIM) 300 MG tablet Take 300 mg by mouth daily.     ASPIRIN 81 PO Take 81 mg by mouth daily.      Azelastine HCl 0.15 % SOLN SPRAY 2 SPRAY BY INTRANASAL ROUTE EVERY DAY IN EACH NOSTRIL 90 mL 1   celecoxib (CELEBREX) 200 MG capsule Take 1 capsule (200 mg total) by mouth daily. 90 capsule 0   Cholecalciferol 50 MCG (2000 UT) TABS Take 2,000 Units by mouth daily.      ipratropium (ATROVENT) 0.06 % nasal spray Place 2 sprays into both nostrils 4 (four) times daily. 15 mL 0   lisinopril (ZESTRIL) 20 MG tablet Take 20 mg by mouth daily.     tadalafil (CIALIS) 20 MG tablet Take 1 tablet, orally, every 72 hours prn 30 tablet 2   No current  facility-administered medications on file prior to visit.    Allergies  Allergen Reactions   Aleve [Naproxen]     Pt reports "shakiness"    Yellow Jacket Venom [Bee Venom] Anaphylaxis   Zilretta [Triamcinolone Acetonide] Palpitations   Augmentin [Amoxicillin-Pot Clavulanate] Other (See Comments)    Bloody stool   Ibuprofen Other (See Comments)    skaking   Levaquin [Levofloxacin] Diarrhea   Oxycodone Nausea And Vomiting   Prednisone Other (See Comments)    Altered mental state   Sudafed [Pseudoephedrine] Rash   Vioxx [Rofecoxib] Rash     DIAGNOSTIC DATA (LABS, IMAGING, TESTING) - I reviewed patient records, labs, notes, testing and imaging myself where available.  Lab Results  Component Value Date   WBC 7.8 08/18/2018   HGB 16.1 08/18/2018   HCT 47.4 08/18/2018   MCV 98.4 08/18/2018   PLT 226.0 08/18/2018      Component Value Date/Time   NA 139 08/18/2018 1057   NA 138 06/04/2016 0000   K 4.6 08/18/2018 1057   CL 102 08/18/2018 1057   CO2 29  08/18/2018 1057   GLUCOSE 84 08/18/2018 1057   BUN 14 08/18/2018 1057   BUN 10 06/04/2016 0000   CREATININE 0.91 08/18/2018 1057   CALCIUM 10.4 08/18/2018 1057   PROT 7.2 08/18/2018 1057   ALBUMIN 4.5 08/18/2018 1057   AST 33 08/18/2018 1057   ALT 34 08/18/2018 1057   ALKPHOS 53 08/18/2018 1057   BILITOT 0.8 08/18/2018 1057   GFRNONAA >60 09/18/2017 1316   GFRAA >60 09/18/2017 1316   Lab Results  Component Value Date   CHOL 215 (H) 08/18/2018   HDL 36.80 (L) 08/18/2018   LDLDIRECT 122.0 08/18/2018   TRIG 337.0 (H) 08/18/2018   CHOLHDL 6 08/18/2018   No results found for: "HGBA1C" No results found for: "VITAMINB12" Lab Results  Component Value Date   TSH 2.49 08/18/2018    PHYSICAL EXAM:  Today's Vitals   03/26/23 1133  BP: (!) 144/52  Pulse: (!) 42  Weight: 290 lb (131.5 kg)  Height: 5\' 11"  (1.803 m)   Body mass index is 40.45 kg/m.   Wt Readings from Last 3 Encounters:  03/26/23 290 lb (131.5 kg)   03/20/22 282 lb (127.9 kg)  10/30/21 298 lb (135.2 kg)     Ht Readings from Last 3 Encounters:  03/26/23 5\' 11"  (1.803 m)  03/20/22 5\' 11"  (1.803 m)  10/30/21 5\' 11"  (1.803 m)      General: The patient is awake, alert and appears not in acute distress. The patient is well groomed. Head: Normocephalic, atraumatic. Neck is supple. Mallampati 3 , peeked palate, crowded teeth.  neck circumference:22 inches . Nasal airflow patent.  Retrognathia is  seen.  Dental status: biological  Cardiovascular:  Regular rate and cardiac rhythm by pulse,  without distended neck veins. Respiratory: Lungs are clear to auscultation.  Skin:  Without evidence of ankle edema, or rash. Trunk: The patient's posture is erect.   Neurologic exam : The patient is awake and alert, oriented to place and time.   Memory subjective described as impaired- easier distracted/ algorithms( what do I need to do next)  .  Attention span & concentration ability appears normal.        02/27/2021    8:55 AM  Montreal Cognitive Assessment   Visuospatial/ Executive (0/5) 4  Naming (0/3) 2  Attention: Read list of digits (0/2) 2  Attention: Read list of letters (0/1) 1  Attention: Serial 7 subtraction starting at 100 (0/3) 3  Language: Repeat phrase (0/2) 2  Language : Fluency (0/1) 1  Abstraction (0/2) 2  Delayed Recall (0/5) 3  Orientation (0/6) 6  Total 26   Impaired stereovision.    Speech is fluent,  without  dysarthria, dysphonia or aphasia.  Mood and affect are appropriate.   Cranial nerves: no loss of smell or taste reported  Pupils are equal and briskly reactive to light.    Funduscopic exam deferred.  He reports visual acuity in the right eye was abnormal with and without glasses.    Extraocular movements in vertical and horizontal planes were intact and without nystagmus. No Diplopia. Visual fields by finger perimetry are intact. Hearing was intact to soft voice and finger rubbing.    Facial sensation  intact to fine touch.  Facial motor strength is symmetric and tongue and uvula move midline.  Neck ROM : rotation, tilt and flexion extension were normal for age and shoulder shrug was symmetrical.    Motor exam:  Symmetric bulk, tone and ROM.   Normal tone without  cog -wheeling, symmetric grip strength .     ASSESSMENT AND PLAN 72 y.o. year -old- male Complex apnea patient ,  here with:    1) Central/ complex sleep apnea - 100% compliant ASV user with great resolution of apnea.  The machine noise is unexplained.  2)  no MCI reported (was tested after COVID )   3)  OSA risk is obesity , BMI over 40.  Osteoarthritis versus bursitis left knee pain, left hip pain.    I plan to follow up either personally or through our NP within 12 months.   I would like to thank Soundra Pilon, FNP for allowing me to meet with and to take care of this pleasant patient.     After spending a total time of  23 minutes face to face and additional time for physical and neurologic examination, review of laboratory studies,  personal review of imaging studies, reports and results of other testing and review of referral information / records as far as provided in visit,   Electronically signed by: Melvyn Novas, MD 03/26/2023 11:43 AM  [facility] Board certified by The [employer] of Sleep Medicine and Diplomate of the [employer] of [employer] Medicine. Board certified In Neurology through the ABPN, Fellow of the [employer] of Neu[employer].

## 2023-03-26 NOTE — Telephone Encounter (Signed)
Mychart message

## 2023-04-27 ENCOUNTER — Other Ambulatory Visit: Payer: Self-pay | Admitting: Sports Medicine

## 2023-04-27 ENCOUNTER — Ambulatory Visit
Admission: RE | Admit: 2023-04-27 | Discharge: 2023-04-27 | Disposition: A | Discharge status: Home / Self Care | Payer: Medicare Other | Source: Ambulatory Visit | Attending: Sports Medicine | Admitting: Sports Medicine

## 2023-04-27 DIAGNOSIS — M9903 Segmental and somatic dysfunction of lumbar region: Secondary | ICD-10-CM | POA: Diagnosis not present

## 2023-04-27 DIAGNOSIS — M25552 Pain in left hip: Secondary | ICD-10-CM

## 2023-04-27 DIAGNOSIS — M9904 Segmental and somatic dysfunction of sacral region: Secondary | ICD-10-CM | POA: Diagnosis not present

## 2023-04-27 DIAGNOSIS — M1990 Unspecified osteoarthritis, unspecified site: Secondary | ICD-10-CM

## 2023-04-27 DIAGNOSIS — M25562 Pain in left knee: Secondary | ICD-10-CM | POA: Diagnosis not present

## 2023-04-27 DIAGNOSIS — M79652 Pain in left thigh: Secondary | ICD-10-CM | POA: Diagnosis not present

## 2023-04-27 DIAGNOSIS — M1612 Unilateral primary osteoarthritis, left hip: Secondary | ICD-10-CM | POA: Diagnosis not present

## 2023-04-27 DIAGNOSIS — M9905 Segmental and somatic dysfunction of pelvic region: Secondary | ICD-10-CM | POA: Diagnosis not present

## 2023-04-27 DIAGNOSIS — M79672 Pain in left foot: Secondary | ICD-10-CM | POA: Diagnosis not present

## 2023-04-27 DIAGNOSIS — M9906 Segmental and somatic dysfunction of lower extremity: Secondary | ICD-10-CM | POA: Diagnosis not present

## 2023-05-06 DIAGNOSIS — R262 Difficulty in walking, not elsewhere classified: Secondary | ICD-10-CM | POA: Diagnosis not present

## 2023-05-06 DIAGNOSIS — M25452 Effusion, left hip: Secondary | ICD-10-CM | POA: Diagnosis not present

## 2023-05-06 DIAGNOSIS — M25552 Pain in left hip: Secondary | ICD-10-CM | POA: Diagnosis not present

## 2023-05-06 DIAGNOSIS — M79652 Pain in left thigh: Secondary | ICD-10-CM | POA: Diagnosis not present

## 2023-05-18 DIAGNOSIS — M25552 Pain in left hip: Secondary | ICD-10-CM | POA: Diagnosis not present

## 2023-05-18 DIAGNOSIS — M9904 Segmental and somatic dysfunction of sacral region: Secondary | ICD-10-CM | POA: Diagnosis not present

## 2023-05-18 DIAGNOSIS — M9903 Segmental and somatic dysfunction of lumbar region: Secondary | ICD-10-CM | POA: Diagnosis not present

## 2023-05-18 DIAGNOSIS — M9905 Segmental and somatic dysfunction of pelvic region: Secondary | ICD-10-CM | POA: Diagnosis not present

## 2023-05-18 DIAGNOSIS — M9906 Segmental and somatic dysfunction of lower extremity: Secondary | ICD-10-CM | POA: Diagnosis not present

## 2023-05-22 DIAGNOSIS — M25462 Effusion, left knee: Secondary | ICD-10-CM | POA: Diagnosis not present

## 2023-05-22 DIAGNOSIS — M25562 Pain in left knee: Secondary | ICD-10-CM | POA: Diagnosis not present

## 2023-06-01 DIAGNOSIS — M25562 Pain in left knee: Secondary | ICD-10-CM | POA: Diagnosis not present

## 2023-06-01 DIAGNOSIS — M1712 Unilateral primary osteoarthritis, left knee: Secondary | ICD-10-CM | POA: Diagnosis not present

## 2023-06-01 DIAGNOSIS — M9906 Segmental and somatic dysfunction of lower extremity: Secondary | ICD-10-CM | POA: Diagnosis not present

## 2023-06-01 DIAGNOSIS — M25552 Pain in left hip: Secondary | ICD-10-CM | POA: Diagnosis not present

## 2023-06-01 DIAGNOSIS — M25462 Effusion, left knee: Secondary | ICD-10-CM | POA: Diagnosis not present

## 2023-06-08 DIAGNOSIS — M25462 Effusion, left knee: Secondary | ICD-10-CM | POA: Diagnosis not present

## 2023-06-08 DIAGNOSIS — M25552 Pain in left hip: Secondary | ICD-10-CM | POA: Diagnosis not present

## 2023-06-08 DIAGNOSIS — M1712 Unilateral primary osteoarthritis, left knee: Secondary | ICD-10-CM | POA: Diagnosis not present

## 2023-06-08 DIAGNOSIS — M25562 Pain in left knee: Secondary | ICD-10-CM | POA: Diagnosis not present

## 2023-06-15 DIAGNOSIS — M25552 Pain in left hip: Secondary | ICD-10-CM | POA: Diagnosis not present

## 2023-06-15 DIAGNOSIS — M25562 Pain in left knee: Secondary | ICD-10-CM | POA: Diagnosis not present

## 2023-06-15 DIAGNOSIS — M1712 Unilateral primary osteoarthritis, left knee: Secondary | ICD-10-CM | POA: Diagnosis not present

## 2023-06-15 DIAGNOSIS — M25462 Effusion, left knee: Secondary | ICD-10-CM | POA: Diagnosis not present

## 2023-07-07 DIAGNOSIS — E291 Testicular hypofunction: Secondary | ICD-10-CM | POA: Diagnosis not present

## 2023-07-07 DIAGNOSIS — M109 Gout, unspecified: Secondary | ICD-10-CM | POA: Diagnosis not present

## 2023-07-07 DIAGNOSIS — M791 Myalgia, unspecified site: Secondary | ICD-10-CM | POA: Diagnosis not present

## 2023-07-07 DIAGNOSIS — I10 Essential (primary) hypertension: Secondary | ICD-10-CM | POA: Diagnosis not present

## 2023-07-07 DIAGNOSIS — R7303 Prediabetes: Secondary | ICD-10-CM | POA: Diagnosis not present

## 2023-07-07 DIAGNOSIS — Z23 Encounter for immunization: Secondary | ICD-10-CM | POA: Diagnosis not present

## 2023-07-07 DIAGNOSIS — M2550 Pain in unspecified joint: Secondary | ICD-10-CM | POA: Diagnosis not present

## 2023-07-07 DIAGNOSIS — R252 Cramp and spasm: Secondary | ICD-10-CM | POA: Diagnosis not present

## 2023-07-07 DIAGNOSIS — N529 Male erectile dysfunction, unspecified: Secondary | ICD-10-CM | POA: Diagnosis not present

## 2023-07-07 DIAGNOSIS — Z6839 Body mass index (BMI) 39.0-39.9, adult: Secondary | ICD-10-CM | POA: Diagnosis not present

## 2023-07-07 DIAGNOSIS — M1990 Unspecified osteoarthritis, unspecified site: Secondary | ICD-10-CM | POA: Diagnosis not present

## 2023-07-08 DIAGNOSIS — M9905 Segmental and somatic dysfunction of pelvic region: Secondary | ICD-10-CM | POA: Diagnosis not present

## 2023-07-08 DIAGNOSIS — M25552 Pain in left hip: Secondary | ICD-10-CM | POA: Diagnosis not present

## 2023-07-08 DIAGNOSIS — M25462 Effusion, left knee: Secondary | ICD-10-CM | POA: Diagnosis not present

## 2023-07-08 DIAGNOSIS — M9906 Segmental and somatic dysfunction of lower extremity: Secondary | ICD-10-CM | POA: Diagnosis not present

## 2023-07-08 DIAGNOSIS — M25562 Pain in left knee: Secondary | ICD-10-CM | POA: Diagnosis not present

## 2023-07-08 DIAGNOSIS — M9904 Segmental and somatic dysfunction of sacral region: Secondary | ICD-10-CM | POA: Diagnosis not present

## 2023-07-23 DIAGNOSIS — M170 Bilateral primary osteoarthritis of knee: Secondary | ICD-10-CM | POA: Diagnosis not present

## 2023-07-23 DIAGNOSIS — R262 Difficulty in walking, not elsewhere classified: Secondary | ICD-10-CM | POA: Diagnosis not present

## 2023-07-23 DIAGNOSIS — M25662 Stiffness of left knee, not elsewhere classified: Secondary | ICD-10-CM | POA: Diagnosis not present

## 2023-07-23 DIAGNOSIS — R29898 Other symptoms and signs involving the musculoskeletal system: Secondary | ICD-10-CM | POA: Diagnosis not present

## 2023-08-03 DIAGNOSIS — R262 Difficulty in walking, not elsewhere classified: Secondary | ICD-10-CM | POA: Diagnosis not present

## 2023-08-03 DIAGNOSIS — M25662 Stiffness of left knee, not elsewhere classified: Secondary | ICD-10-CM | POA: Diagnosis not present

## 2023-08-03 DIAGNOSIS — R29898 Other symptoms and signs involving the musculoskeletal system: Secondary | ICD-10-CM | POA: Diagnosis not present

## 2023-08-03 DIAGNOSIS — M170 Bilateral primary osteoarthritis of knee: Secondary | ICD-10-CM | POA: Diagnosis not present

## 2023-08-05 DIAGNOSIS — M25662 Stiffness of left knee, not elsewhere classified: Secondary | ICD-10-CM | POA: Diagnosis not present

## 2023-08-05 DIAGNOSIS — R29898 Other symptoms and signs involving the musculoskeletal system: Secondary | ICD-10-CM | POA: Diagnosis not present

## 2023-08-05 DIAGNOSIS — R262 Difficulty in walking, not elsewhere classified: Secondary | ICD-10-CM | POA: Diagnosis not present

## 2023-08-05 DIAGNOSIS — M170 Bilateral primary osteoarthritis of knee: Secondary | ICD-10-CM | POA: Diagnosis not present

## 2023-08-11 DIAGNOSIS — R29898 Other symptoms and signs involving the musculoskeletal system: Secondary | ICD-10-CM | POA: Diagnosis not present

## 2023-08-11 DIAGNOSIS — M25662 Stiffness of left knee, not elsewhere classified: Secondary | ICD-10-CM | POA: Diagnosis not present

## 2023-08-11 DIAGNOSIS — M170 Bilateral primary osteoarthritis of knee: Secondary | ICD-10-CM | POA: Diagnosis not present

## 2023-08-11 DIAGNOSIS — R262 Difficulty in walking, not elsewhere classified: Secondary | ICD-10-CM | POA: Diagnosis not present

## 2023-09-01 DIAGNOSIS — M25662 Stiffness of left knee, not elsewhere classified: Secondary | ICD-10-CM | POA: Diagnosis not present

## 2023-09-01 DIAGNOSIS — R29898 Other symptoms and signs involving the musculoskeletal system: Secondary | ICD-10-CM | POA: Diagnosis not present

## 2023-09-01 DIAGNOSIS — M170 Bilateral primary osteoarthritis of knee: Secondary | ICD-10-CM | POA: Diagnosis not present

## 2023-09-01 DIAGNOSIS — R262 Difficulty in walking, not elsewhere classified: Secondary | ICD-10-CM | POA: Diagnosis not present

## 2023-09-04 DIAGNOSIS — R29898 Other symptoms and signs involving the musculoskeletal system: Secondary | ICD-10-CM | POA: Diagnosis not present

## 2023-09-04 DIAGNOSIS — R262 Difficulty in walking, not elsewhere classified: Secondary | ICD-10-CM | POA: Diagnosis not present

## 2023-09-04 DIAGNOSIS — M25662 Stiffness of left knee, not elsewhere classified: Secondary | ICD-10-CM | POA: Diagnosis not present

## 2023-09-04 DIAGNOSIS — M170 Bilateral primary osteoarthritis of knee: Secondary | ICD-10-CM | POA: Diagnosis not present

## 2023-11-27 DIAGNOSIS — G4710 Hypersomnia, unspecified: Secondary | ICD-10-CM | POA: Diagnosis not present

## 2023-11-27 DIAGNOSIS — Z630 Problems in relationship with spouse or partner: Secondary | ICD-10-CM | POA: Diagnosis not present

## 2023-11-27 DIAGNOSIS — G939 Disorder of brain, unspecified: Secondary | ICD-10-CM | POA: Diagnosis not present

## 2023-11-27 DIAGNOSIS — F0394 Unspecified dementia, unspecified severity, with anxiety: Secondary | ICD-10-CM | POA: Diagnosis not present

## 2023-11-27 DIAGNOSIS — Z881 Allergy status to other antibiotic agents status: Secondary | ICD-10-CM | POA: Diagnosis not present

## 2023-11-27 DIAGNOSIS — G9340 Encephalopathy, unspecified: Secondary | ICD-10-CM | POA: Diagnosis not present

## 2023-11-27 DIAGNOSIS — Z820 Family history of epilepsy and other diseases of the nervous system: Secondary | ICD-10-CM | POA: Diagnosis not present

## 2023-11-27 DIAGNOSIS — Z559 Problems related to education and literacy, unspecified: Secondary | ICD-10-CM | POA: Diagnosis not present

## 2023-11-27 DIAGNOSIS — R4182 Altered mental status, unspecified: Secondary | ICD-10-CM | POA: Diagnosis not present

## 2023-11-27 DIAGNOSIS — I10 Essential (primary) hypertension: Secondary | ICD-10-CM | POA: Diagnosis not present

## 2023-11-27 DIAGNOSIS — G4733 Obstructive sleep apnea (adult) (pediatric): Secondary | ICD-10-CM | POA: Diagnosis not present

## 2023-11-27 DIAGNOSIS — Z888 Allergy status to other drugs, medicaments and biological substances status: Secondary | ICD-10-CM | POA: Diagnosis not present

## 2023-11-27 DIAGNOSIS — Z6834 Body mass index (BMI) 34.0-34.9, adult: Secondary | ICD-10-CM | POA: Diagnosis not present

## 2023-11-27 DIAGNOSIS — R634 Abnormal weight loss: Secondary | ICD-10-CM | POA: Diagnosis not present

## 2023-11-27 DIAGNOSIS — G9389 Other specified disorders of brain: Secondary | ICD-10-CM | POA: Diagnosis not present

## 2023-11-27 DIAGNOSIS — T464X6A Underdosing of angiotensin-converting-enzyme inhibitors, initial encounter: Secondary | ICD-10-CM | POA: Diagnosis not present

## 2023-11-27 DIAGNOSIS — Z886 Allergy status to analgesic agent status: Secondary | ICD-10-CM | POA: Diagnosis not present

## 2023-11-27 DIAGNOSIS — Z87828 Personal history of other (healed) physical injury and trauma: Secondary | ICD-10-CM | POA: Diagnosis not present

## 2023-11-27 DIAGNOSIS — I4510 Unspecified right bundle-branch block: Secondary | ICD-10-CM | POA: Diagnosis not present

## 2023-11-27 DIAGNOSIS — G3183 Dementia with Lewy bodies: Secondary | ICD-10-CM | POA: Diagnosis not present

## 2023-11-27 DIAGNOSIS — R419 Unspecified symptoms and signs involving cognitive functions and awareness: Secondary | ICD-10-CM | POA: Diagnosis not present

## 2023-11-27 DIAGNOSIS — Z560 Unemployment, unspecified: Secondary | ICD-10-CM | POA: Diagnosis not present

## 2023-11-27 DIAGNOSIS — Z5986 Financial insecurity: Secondary | ICD-10-CM | POA: Diagnosis not present

## 2023-11-27 DIAGNOSIS — J323 Chronic sphenoidal sinusitis: Secondary | ICD-10-CM | POA: Diagnosis not present

## 2023-11-27 DIAGNOSIS — E663 Overweight: Secondary | ICD-10-CM | POA: Diagnosis not present

## 2023-11-27 DIAGNOSIS — F32A Depression, unspecified: Secondary | ICD-10-CM | POA: Diagnosis not present

## 2023-11-27 DIAGNOSIS — Z818 Family history of other mental and behavioral disorders: Secondary | ICD-10-CM | POA: Diagnosis not present

## 2023-11-27 DIAGNOSIS — Z91128 Patient's intentional underdosing of medication regimen for other reason: Secondary | ICD-10-CM | POA: Diagnosis not present

## 2023-11-27 DIAGNOSIS — Z91030 Bee allergy status: Secondary | ICD-10-CM | POA: Diagnosis not present

## 2023-11-27 DIAGNOSIS — Z885 Allergy status to narcotic agent status: Secondary | ICD-10-CM | POA: Diagnosis not present

## 2023-11-27 DIAGNOSIS — R29810 Facial weakness: Secondary | ICD-10-CM | POA: Diagnosis not present

## 2023-11-28 DIAGNOSIS — G4733 Obstructive sleep apnea (adult) (pediatric): Secondary | ICD-10-CM | POA: Diagnosis not present

## 2023-11-28 DIAGNOSIS — G9340 Encephalopathy, unspecified: Secondary | ICD-10-CM | POA: Diagnosis not present

## 2023-11-28 DIAGNOSIS — R29810 Facial weakness: Secondary | ICD-10-CM | POA: Diagnosis not present

## 2023-11-28 DIAGNOSIS — R4182 Altered mental status, unspecified: Secondary | ICD-10-CM | POA: Diagnosis not present

## 2023-11-28 DIAGNOSIS — G9389 Other specified disorders of brain: Secondary | ICD-10-CM | POA: Diagnosis not present

## 2023-11-28 DIAGNOSIS — J323 Chronic sphenoidal sinusitis: Secondary | ICD-10-CM | POA: Diagnosis not present

## 2023-11-28 DIAGNOSIS — I10 Essential (primary) hypertension: Secondary | ICD-10-CM | POA: Diagnosis not present

## 2023-11-29 DIAGNOSIS — G939 Disorder of brain, unspecified: Secondary | ICD-10-CM | POA: Diagnosis not present

## 2023-12-08 ENCOUNTER — Encounter: Payer: Self-pay | Admitting: Neurology

## 2023-12-08 ENCOUNTER — Ambulatory Visit (INDEPENDENT_AMBULATORY_CARE_PROVIDER_SITE_OTHER): Admitting: Neurology

## 2023-12-08 VITALS — BP 122/72 | HR 54 | Ht 71.0 in | Wt 238.0 lb

## 2023-12-08 DIAGNOSIS — G4739 Other sleep apnea: Secondary | ICD-10-CM

## 2023-12-08 DIAGNOSIS — R4189 Other symptoms and signs involving cognitive functions and awareness: Secondary | ICD-10-CM

## 2023-12-08 DIAGNOSIS — R4182 Altered mental status, unspecified: Secondary | ICD-10-CM

## 2023-12-08 DIAGNOSIS — R451 Restlessness and agitation: Secondary | ICD-10-CM | POA: Insufficient documentation

## 2023-12-08 MED ORDER — VITAMIN B 12 250 MCG PO LOZG
250.0000 ug | LOZENGE | Freq: Every day | ORAL | 3 refills | Status: AC
Start: 2023-12-08 — End: ?

## 2023-12-08 NOTE — Patient Instructions (Signed)
 SSESSMENT AND PLAN 73 y.o. year old male  here with:    1) recent episode of  multi-day insomnia and  psychotic break down, culminating after 5 sleepless nights,  admitted to [facility] facility of [facility].     2) abnormal CMET / CBC : plan add vit B 12 under the tongue daily.   3) EEG repeat order here at [facility] , based on abnormality found on MRI . Calcification right frontal lobe.   4) referral to geronto - psychiatry,  Dr. Levie Ream.    New MOCA in 3 months. Until then: continue Seroquel 12.5 mg at bed time and depakote 1000 mg at bedtime.   In 3 months if MOCA remains reduced, will consider Aricept.

## 2023-12-08 NOTE — Addendum Note (Signed)
 Addended by: Neomia Banner on: 12/08/2023 04:56 PM   Modules accepted: Orders

## 2023-12-08 NOTE — Progress Notes (Signed)
 Provider:  Neomia Banner, MD  Primary Care Physician:  Alejandro Hurt, FNP [phone] [address]. [address] [place] [address]     Referring Provider:   Alejandro Hurt, Fnp [phone] [address]. [address] [place],  [place] [address]          Chief Complaint according to patient   Patient presents with:                HISTORY OF PRESENT ILLNESS:  Craig Farrell is a 74 y.o. male patient who is here for revisit 12/08/2023 for a recent acute MS change -  The patient is known to me only as a sleep apnea patient.   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  He felt less appreciated and his marriage has suffered. He felt very depressed, he felt overwhelmed.  He recently went into a psychological crisis,  he couldn't sleep for 5 days , his mind was relentlessly racing, he was obsessing, and became paranoid. He presented to the [facility] [place] and was admitted.  Dx with acute psychosis disorder.  He was placed on Seroquel and Depakote and finally slept . He was discharged on these medications but I have not seen a psychiatrist - referral. The d/c note stated that he needs a follow up with Neurology to evaluate for possible dementia , fronto temporal . This visit is not dedicated to sleep neurology:  Admitted from 5-9- 12-02-2023,  treated with Haldol upon admission, then Seroquel and discharged on 25 mg , Depakote 500 mg 2 at bedtime-  normal BMET, increased MCV and low RBC.  B 12 , folic acid and iron _ Needs supplement.   MRI brain : Clustered calcification with wispy enhancement along the  surface of the right frontal lobe, calcification appearing  dystrophic by CT. Area of enhancement measuring approximately 6 mm.  No underlying gliosis, swelling or edema. No multiple calcifications  typical of a remote granulomatous or opportunistic infection.   This is from a traumatic injury ? I will order an  EEG.    He has returned to baseline , is able today to use his banking on the phone. Makes coffee in AM and remembers recipes. Keeping track of appointments and bills.  We are adding a MOCA test today. Reviewed labs and imaging studies.   Hx: He had 2 previous episodes of psychosis- one at age 34 with the birth of their first child, one brought on by steroids in 06-2011 and now.  Bipolar ?      03-26-2023,  Sleep medicine clinic:  Craig Farrell is a 73 y.o. male patient who is self-employed- here for revisit 03/26/2023 for yearly ASV follow up..  Chief concern according to patient :  Here reporting hip pain, possible bursitis, affecting sleep only seldomly. Glaucoma, dry eye, his wife has been very sick, has gotten her 4 th pacemaker, was hospitalized  just  the for fourth time this year- and got a multi-lead pacemaker. He has been out of work for [employer].     His current ASV is a new machine , the fourth model, third replacement.  All have produced some noise after several hours of quiet running.- previously reported. The water chamber is not empty, there was no large air leak, his last model but replaced a previous ASV machine has again started just now to  make this noise.  He was able to demonstrate the noises to the DME technologist, who was surprised and had never heard these before.  Apparently the machine may run 5 to 7 hours without making these noises.  I cannot explain why this would happen.  The DME has exchanged the machines from different lots and different serial numbers and yet has not been able to find a resolution.  Craig Farrell is a very compliant ASV use at 100% of days and 100% of hours with an average of 8 hours 13 minutes his expiratory pressure relief is set at 4 cm water his minimum pressure support is 4 and maximum pressure support 15 cm water.  His AHI is 0.8 which is a dream.  REDSMED N 20  /nasal cradle- He does have an air leak of 17.7 L a minute this is the 95th percentile and  is rather high.  He does have facial hair which can interfere but he has used nasal pillows that should seal easier.               Seen here on 03/20/2022 in a RV.  Works as a Psychologist, counselling.  Presents for follow up with ASV. DME [facility]. 4th machine in the last 6 mths. about 2/3 am it starts making him feel like suffocating. He will wake up and go to bathroom and then when he lays down there is a noise clicking. States that he has had to change out machine multiple times.    He is now happy with the current humidity, ASV , setting: [facility] DME- service made him happy.  Craig Farrell has actually gone through several machines he had a ongoing problem that between 2 and 3 AM machine started to make a clicking noise that woke him it seemed to revive from the humidifier chamber.  As he adjusted the humidity this became less and less frequent.  He had undergone a new sleep study on 11-14-2021.  The machine that it replaced was from December 2017.  AHI was 0.5 on the old settings he was highly compliant he used a minimum pressure support of 4 maximum of 14 and EPAP of 4 cm water.  The central apnea was no longer present under the settings he used on a Voora nasal cushion mask and medium size.  The resulting AHI was 0.  On the settings he has remained 100% compliant by days and time with an average use at time of 7 hours 48 minutes.  The AHI is 0.5.  He has a mild air leak.  I am very happy with symptomatic resolution of his sleep apnea. Chin strap was tried, but he did not like it.  His AHI is 0.5/h which is a good resolution of apnea and he is a compliant user for 7 hours 39 minutes on average daily.   He is on an ASV machine : minimum pressure support of 4 cmH2O maximum pressure support 14 cmH2O and his expiratory pressure is 4 cm so I would like to replace his machine as soon as possible so that he does not get into any trouble.     Review of Systems: Out of a complete 14 system review, the patient  complains of only the following symptoms, and all other reviewed systems are negative.:   Social History   Socioeconomic History   Marital status: Married    Spouse name: Not on file   Number of children: 1   Years of education: 13  Highest education level: Not on file  Occupational History   Not on file  Tobacco Use   Smoking status: Never   Smokeless tobacco: Never  Vaping Use   Vaping status: Never Used  Substance and Sexual Activity   Alcohol use: No   Drug use: No   Sexual activity: Not on file  Other Topics Concern   Not on file  Social History Narrative   Married 1973. 1 child 57 in [place]- will be in [place] still. 35 month old grandbaby 04/2017.       3 years of college. 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       Two cups caffeine daily.      Right-handed.   Social Drivers of Corporate investment banker Strain: Not on file  Food Insecurity: No Food Insecurity (01/23/2021)   [facility], [facility]   Hunger Vital Sign    Worried About Running Out of Food in the Last Year: Never true    Ran Out of Food in the Last Year: Never true  Transportation Needs: Not on file  Physical Activity: Not on file  Stress: Not on file  Social Connections: Unknown (12/03/2021)   Received from [facility], [facility]   Social Network    Social Network: Not on file    Family History  Problem Relation Age of Onset   Arthritis Mother    Colon cancer Mother        late 79s   Arthritis Sister    Arthritis Sister    Heart failure Father    Parkinson's disease Father    Dementia Father    Colon cancer Maternal Aunt    Healthy Child     Past Medical History:  Diagnosis Date   Allergic rhinitis    astelin    Arthritis    Hips and shoulders. celebrex  200mg  BID. plans to cut down with medicare costs   Blood in stool    augmentin   Erectile dysfunction     cialis  20mg    Hx of diverticulitis of colon    Hypertension    lisinopril  10mg    OSA on CPAP    Sleep apnea    ASV machine   Tubular adenoma of colon    2014- was told q3 years- needs update. likely adenoma   UTI (urinary tract infection)    x1- in hospital.    Yellow jacket sting allergy    epi pens    Past Surgical History:  Procedure Laterality Date   HAND SURGERY Right    left foot surgery     fracture related   left knee surgery     1980     Current Outpatient Medications on File Prior to Visit  Medication Sig Dispense Refill   acetaminophen (TYLENOL) 500 MG tablet Take by mouth as needed.     allopurinol (ZYLOPRIM) 300 MG tablet Take 300 mg by mouth daily.     ASPIRIN 81 PO Take 81 mg by mouth daily.      Azelastine  HCl 0.15 % SOLN SPRAY 2 SPRAY BY INTRANASAL ROUTE EVERY DAY IN EACH NOSTRIL 90 mL 1   celecoxib  (CELEBREX ) 200 MG capsule Take 1 capsule (200 mg total) by mouth daily. 90 capsule 0   Cholecalciferol 50 MCG (2000 UT) TABS Take 2,000 Units by mouth daily.      ipratropium (ATROVENT )  0.06 % nasal spray Place 2 sprays into both nostrils 4 (four) times daily. 15 mL 0   lisinopril  (ZESTRIL ) 20 MG tablet Take 20 mg by mouth daily.     tadalafil  (CIALIS ) 20 MG tablet Take 1 tablet, orally, every 72 hours prn 30 tablet 2   No current facility-administered medications on file prior to visit.    Allergies  Allergen Reactions   Aleve [Naproxen]     Pt reports "shakiness"    Yellow Jacket Venom [Bee Venom] Anaphylaxis   Zilretta [Triamcinolone Acetonide] Palpitations   Augmentin [Amoxicillin-Pot Clavulanate] Other (See Comments)    Bloody stool   Ibuprofen Other (See Comments)    skaking   Levaquin [Levofloxacin] Diarrhea   Oxycodone Nausea And Vomiting   Prednisone Other (See Comments)    Altered mental state   Sudafed [Pseudoephedrine] Rash   Vioxx [Rofecoxib] Rash     DIAGNOSTIC DATA (LABS, IMAGING, TESTING) - I reviewed patient records, labs,  notes, testing and imaging myself where available.  Lab Results  Component Value Date   WBC 7.8 08/18/2018   HGB 16.1 08/18/2018   HCT 47.4 08/18/2018   MCV 98.4 08/18/2018   PLT 226.0 08/18/2018      Component Value Date/Time   NA 139 08/18/2018 1057   NA 138 06/04/2016 0000   K 4.6 08/18/2018 1057   CL 102 08/18/2018 1057   CO2 29 08/18/2018 1057   GLUCOSE 84 08/18/2018 1057   BUN 14 08/18/2018 1057   BUN 10 06/04/2016 0000   CREATININE 0.91 08/18/2018 1057   CALCIUM  10.4 08/18/2018 1057   PROT 7.2 08/18/2018 1057   ALBUMIN 4.5 08/18/2018 1057   AST 33 08/18/2018 1057   ALT 34 08/18/2018 1057   ALKPHOS 53 08/18/2018 1057   BILITOT 0.8 08/18/2018 1057   GFRNONAA >60 09/18/2017 1316   GFRAA >60 09/18/2017 1316   Lab Results  Component Value Date   CHOL 215 (H) 08/18/2018   HDL 36.80 (L) 08/18/2018   LDLDIRECT 122.0 08/18/2018   TRIG 337.0 (H) 08/18/2018   CHOLHDL 6 08/18/2018   No results found for: "HGBA1C" No results found for: "VITAMINB12" Lab Results  Component Value Date   TSH 2.49 08/18/2018      PHYSICAL EXAM:  Today's Vitals   12/08/23 1132  BP: 122/72  Pulse: (!) 54  Weight: 238 lb (108 kg)  Height: 5\' 11"  (1.803 m)   Body mass index is 33.19 kg/m.   Wt Readings from Last 3 Encounters:  12/08/23 238 lb (108 kg)  03/26/23 290 lb (131.5 kg)  03/20/22 282 lb (127.9 kg)     Ht Readings from Last 3 Encounters:  12/08/23 5\' 11"  (1.803 m)  03/26/23 5\' 11"  (1.803 m)  03/20/22 5\' 11"  (1.803 m)      General: The patient is awake, alert and appears not in acute distress. The patient is well groomed. Head: Normocephalic, atraumatic. Neck is supple.  Cardiovascular:  Regular rate and cardiac rhythm by pulse,  without distended neck veins. Respiratory: Lungs are clear to auscultation.  Skin:  Without evidence of ankle edema, or rash. Trunk: The patient's posture is erect.   NEUROLOGIC EXAM: The patient is awake and alert, oriented to place  and time.   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     Speech is fluent,  without  dysarthria, dysphonia or aphasia.  Mood and affect are depressed.    Cranial nerves: no loss of smell or taste reported  Pupils are equal and briskly reactive to light. Funduscopic exam  normal . .  Extraocular movements in vertical and horizontal planes were intact and without nystagmus. No Diplopia. Visual fields by finger perimetry are intact. Hearing was intact to soft voice and finger rubbing.    Facial sensation intact to fine touch.  Facial motor strength is symmetric and tongue and uvula move midline.  Neck ROM : rotation, tilt and flexion extension were normal for age and shoulder shrug was symmetrical.    Coordination: Rapid alternating movements in the fingers/hands were of normal speed.  The Finger-to-nose maneuver was intact without evidence of ataxia, dysmetria or tremor.   Gait and station: Patient could rise unassisted from a seated position, walked without assistive device.  Stance is of normal width/ base .  Toe and heel walk were deferred.  Deep tendon reflexes: in the  upper and lower extremities are attenuated- failure to relax ? symmetric.      ASSESSMENT AND PLAN 73 y.o. year old male  here with:    1) recent episode of  insomnia and  psychotic break down, culminating into 5  sleepless nights,  admitted to [facility].  I believe this was a manic episode ( the third in his life )  and he has a strong family history of depression.    2) abnormal CMET / CBC : plan add vit B 12 under the tongue daily.   3) EEG repeat order here at [facility] , based on abnormality found on MRI . Calcification right frontal lobe.   4) referral to geronto - psychiatry,  Dr. Levie Ream.    New MOCA in 3 months. Until then: continue Seroquel 12.5 mg at bed time and depakote 1000 mg at bedtime.   In 3 months if MOCA remains reduced, will consider Aricept.      I would like to thank Alejandro Hurt, FNP, for allowing me to meet with and to take care of this pleasant patient.     After spending a total time of  45  minutes face to face and additional time for physical and neurologic examination, review of laboratory studies,  personal review of imaging studies, reports and results of other testing and review of referral information / records as far as provided in visit,   Electronically signed by: Neomia Banner, MD 12/08/2023 11:41 AM  [facility] Board certified by The [employer] of Sleep Medicine and Diplomate of the [employer] of Sleep Medicine. Board certified In Neurology through the ABPN, Fellow of the [employer] of Neurology.

## 2023-12-09 ENCOUNTER — Telehealth: Payer: Self-pay | Admitting: Neurology

## 2023-12-09 NOTE — Telephone Encounter (Signed)
Referral for psychiatry fax to [facility]. Phone: [phone], Fax: [phone]

## 2023-12-15 ENCOUNTER — Ambulatory Visit (INDEPENDENT_AMBULATORY_CARE_PROVIDER_SITE_OTHER): Admitting: *Deleted

## 2023-12-15 DIAGNOSIS — R4189 Other symptoms and signs involving cognitive functions and awareness: Secondary | ICD-10-CM

## 2023-12-15 DIAGNOSIS — G4739 Other sleep apnea: Secondary | ICD-10-CM

## 2023-12-15 DIAGNOSIS — R451 Restlessness and agitation: Secondary | ICD-10-CM

## 2023-12-15 DIAGNOSIS — R4182 Altered mental status, unspecified: Secondary | ICD-10-CM | POA: Diagnosis not present

## 2023-12-16 ENCOUNTER — Ambulatory Visit: Payer: Self-pay | Admitting: Neurology

## 2023-12-16 NOTE — Procedures (Signed)
    History:  73 year old man with altered mental status changes   EEG classification: Awake and drowsy  Duration: 26 minutes   Technical aspects: This EEG study was done with scalp electrodes positioned according to the 10-20 International system of electrode placement. Electrical activity was reviewed with band pass filter of 1-70Hz , sensitivity of 7 uV/mm, display speed of 51mm/sec with a 60Hz  notched filter applied as appropriate. EEG data were recorded continuously and digitally stored.   Description of the recording: The background rhythms of this recording consists of a fairly well modulated medium amplitude alpha rhythm of 8 Hz that is reactive to eye opening and closure. Present in the anterior head region is a 15-20 Hz beta activity. Photic stimulation was performed, did not show any abnormalities. Hyperventilation was also performed, did not show any abnormalities. Drowsiness was manifested by background fragmentation. No abnormal epileptiform discharges seen during this recording. There was no focal slowing. There were no electrographic seizure identified.   Abnormality: None   Impression: This is a normal awake and drowsy EEG. No evidence of interictal epileptiform discharges. Normal EEGs, however, do not rule out epilepsy.    Meleni Delahunt, MD [facility]

## 2023-12-24 DIAGNOSIS — G4700 Insomnia, unspecified: Secondary | ICD-10-CM | POA: Diagnosis not present

## 2023-12-24 DIAGNOSIS — I10 Essential (primary) hypertension: Secondary | ICD-10-CM | POA: Diagnosis not present

## 2023-12-24 NOTE — Telephone Encounter (Signed)
 [facility] attempted to call pt regarding Referral 12/18/23 , Traid states will contact pt again and update about Appt dates and time scheduled .

## 2024-01-06 DIAGNOSIS — R399 Unspecified symptoms and signs involving the genitourinary system: Secondary | ICD-10-CM | POA: Diagnosis not present

## 2024-02-08 DIAGNOSIS — G4700 Insomnia, unspecified: Secondary | ICD-10-CM | POA: Diagnosis not present

## 2024-02-08 DIAGNOSIS — Z0000 Encounter for general adult medical examination without abnormal findings: Secondary | ICD-10-CM | POA: Diagnosis not present

## 2024-02-08 DIAGNOSIS — E782 Mixed hyperlipidemia: Secondary | ICD-10-CM | POA: Diagnosis not present

## 2024-02-08 DIAGNOSIS — I10 Essential (primary) hypertension: Secondary | ICD-10-CM | POA: Diagnosis not present

## 2024-02-08 DIAGNOSIS — M1990 Unspecified osteoarthritis, unspecified site: Secondary | ICD-10-CM | POA: Diagnosis not present

## 2024-02-08 DIAGNOSIS — R7303 Prediabetes: Secondary | ICD-10-CM | POA: Diagnosis not present

## 2024-02-08 DIAGNOSIS — R5383 Other fatigue: Secondary | ICD-10-CM | POA: Diagnosis not present

## 2024-02-08 DIAGNOSIS — E291 Testicular hypofunction: Secondary | ICD-10-CM | POA: Diagnosis not present

## 2024-02-08 DIAGNOSIS — Z6834 Body mass index (BMI) 34.0-34.9, adult: Secondary | ICD-10-CM | POA: Diagnosis not present

## 2024-02-08 DIAGNOSIS — E039 Hypothyroidism, unspecified: Secondary | ICD-10-CM | POA: Diagnosis not present

## 2024-02-08 DIAGNOSIS — E559 Vitamin D deficiency, unspecified: Secondary | ICD-10-CM | POA: Diagnosis not present

## 2024-02-08 DIAGNOSIS — J302 Other seasonal allergic rhinitis: Secondary | ICD-10-CM | POA: Diagnosis not present

## 2024-02-08 DIAGNOSIS — M25511 Pain in right shoulder: Secondary | ICD-10-CM | POA: Diagnosis not present

## 2024-02-08 DIAGNOSIS — M109 Gout, unspecified: Secondary | ICD-10-CM | POA: Diagnosis not present

## 2024-02-08 DIAGNOSIS — N529 Male erectile dysfunction, unspecified: Secondary | ICD-10-CM | POA: Diagnosis not present

## 2024-03-02 DIAGNOSIS — M25562 Pain in left knee: Secondary | ICD-10-CM | POA: Diagnosis not present

## 2024-03-02 DIAGNOSIS — M25511 Pain in right shoulder: Secondary | ICD-10-CM | POA: Diagnosis not present

## 2024-03-14 DIAGNOSIS — M7541 Impingement syndrome of right shoulder: Secondary | ICD-10-CM | POA: Diagnosis not present

## 2024-03-14 DIAGNOSIS — M25511 Pain in right shoulder: Secondary | ICD-10-CM | POA: Diagnosis not present

## 2024-03-14 DIAGNOSIS — M25611 Stiffness of right shoulder, not elsewhere classified: Secondary | ICD-10-CM | POA: Diagnosis not present

## 2024-03-14 DIAGNOSIS — M799 Soft tissue disorder, unspecified: Secondary | ICD-10-CM | POA: Diagnosis not present

## 2024-03-16 DIAGNOSIS — M25511 Pain in right shoulder: Secondary | ICD-10-CM | POA: Diagnosis not present

## 2024-03-16 DIAGNOSIS — M25562 Pain in left knee: Secondary | ICD-10-CM | POA: Diagnosis not present

## 2024-03-17 DIAGNOSIS — M7541 Impingement syndrome of right shoulder: Secondary | ICD-10-CM | POA: Diagnosis not present

## 2024-03-17 DIAGNOSIS — M25511 Pain in right shoulder: Secondary | ICD-10-CM | POA: Diagnosis not present

## 2024-03-17 DIAGNOSIS — M799 Soft tissue disorder, unspecified: Secondary | ICD-10-CM | POA: Diagnosis not present

## 2024-03-17 DIAGNOSIS — M25611 Stiffness of right shoulder, not elsewhere classified: Secondary | ICD-10-CM | POA: Diagnosis not present

## 2024-03-22 DIAGNOSIS — M25611 Stiffness of right shoulder, not elsewhere classified: Secondary | ICD-10-CM | POA: Diagnosis not present

## 2024-03-22 DIAGNOSIS — M7541 Impingement syndrome of right shoulder: Secondary | ICD-10-CM | POA: Diagnosis not present

## 2024-03-22 DIAGNOSIS — M25511 Pain in right shoulder: Secondary | ICD-10-CM | POA: Diagnosis not present

## 2024-03-22 DIAGNOSIS — M799 Soft tissue disorder, unspecified: Secondary | ICD-10-CM | POA: Diagnosis not present

## 2024-03-24 DIAGNOSIS — M799 Soft tissue disorder, unspecified: Secondary | ICD-10-CM | POA: Diagnosis not present

## 2024-03-24 DIAGNOSIS — M25511 Pain in right shoulder: Secondary | ICD-10-CM | POA: Diagnosis not present

## 2024-03-24 DIAGNOSIS — M25611 Stiffness of right shoulder, not elsewhere classified: Secondary | ICD-10-CM | POA: Diagnosis not present

## 2024-03-24 DIAGNOSIS — M7541 Impingement syndrome of right shoulder: Secondary | ICD-10-CM | POA: Diagnosis not present

## 2024-03-30 ENCOUNTER — Ambulatory Visit: Payer: Medicare Other | Admitting: Neurology

## 2024-04-01 DIAGNOSIS — M7541 Impingement syndrome of right shoulder: Secondary | ICD-10-CM | POA: Diagnosis not present

## 2024-04-01 DIAGNOSIS — M25611 Stiffness of right shoulder, not elsewhere classified: Secondary | ICD-10-CM | POA: Diagnosis not present

## 2024-04-01 DIAGNOSIS — M799 Soft tissue disorder, unspecified: Secondary | ICD-10-CM | POA: Diagnosis not present

## 2024-04-01 DIAGNOSIS — M25511 Pain in right shoulder: Secondary | ICD-10-CM | POA: Diagnosis not present

## 2024-04-05 DIAGNOSIS — M799 Soft tissue disorder, unspecified: Secondary | ICD-10-CM | POA: Diagnosis not present

## 2024-04-05 DIAGNOSIS — M7541 Impingement syndrome of right shoulder: Secondary | ICD-10-CM | POA: Diagnosis not present

## 2024-04-05 DIAGNOSIS — M25611 Stiffness of right shoulder, not elsewhere classified: Secondary | ICD-10-CM | POA: Diagnosis not present

## 2024-04-05 DIAGNOSIS — M25511 Pain in right shoulder: Secondary | ICD-10-CM | POA: Diagnosis not present

## 2024-04-12 DIAGNOSIS — M25511 Pain in right shoulder: Secondary | ICD-10-CM | POA: Diagnosis not present

## 2024-04-12 DIAGNOSIS — M25611 Stiffness of right shoulder, not elsewhere classified: Secondary | ICD-10-CM | POA: Diagnosis not present

## 2024-04-12 DIAGNOSIS — M799 Soft tissue disorder, unspecified: Secondary | ICD-10-CM | POA: Diagnosis not present

## 2024-04-12 DIAGNOSIS — M7541 Impingement syndrome of right shoulder: Secondary | ICD-10-CM | POA: Diagnosis not present

## 2024-04-13 DIAGNOSIS — M25562 Pain in left knee: Secondary | ICD-10-CM | POA: Diagnosis not present

## 2024-04-13 DIAGNOSIS — G8929 Other chronic pain: Secondary | ICD-10-CM | POA: Diagnosis not present

## 2024-04-13 DIAGNOSIS — M1712 Unilateral primary osteoarthritis, left knee: Secondary | ICD-10-CM | POA: Diagnosis not present

## 2024-04-13 DIAGNOSIS — M542 Cervicalgia: Secondary | ICD-10-CM | POA: Diagnosis not present

## 2024-04-19 DIAGNOSIS — M25511 Pain in right shoulder: Secondary | ICD-10-CM | POA: Diagnosis not present

## 2024-04-19 DIAGNOSIS — M799 Soft tissue disorder, unspecified: Secondary | ICD-10-CM | POA: Diagnosis not present

## 2024-04-19 DIAGNOSIS — M25611 Stiffness of right shoulder, not elsewhere classified: Secondary | ICD-10-CM | POA: Diagnosis not present

## 2024-04-19 DIAGNOSIS — M7541 Impingement syndrome of right shoulder: Secondary | ICD-10-CM | POA: Diagnosis not present

## 2024-04-22 DIAGNOSIS — M7541 Impingement syndrome of right shoulder: Secondary | ICD-10-CM | POA: Diagnosis not present

## 2024-04-22 DIAGNOSIS — M25511 Pain in right shoulder: Secondary | ICD-10-CM | POA: Diagnosis not present

## 2024-04-22 DIAGNOSIS — M25611 Stiffness of right shoulder, not elsewhere classified: Secondary | ICD-10-CM | POA: Diagnosis not present

## 2024-04-22 DIAGNOSIS — M799 Soft tissue disorder, unspecified: Secondary | ICD-10-CM | POA: Diagnosis not present

## 2024-04-26 DIAGNOSIS — M799 Soft tissue disorder, unspecified: Secondary | ICD-10-CM | POA: Diagnosis not present

## 2024-04-26 DIAGNOSIS — M7541 Impingement syndrome of right shoulder: Secondary | ICD-10-CM | POA: Diagnosis not present

## 2024-04-26 DIAGNOSIS — M25611 Stiffness of right shoulder, not elsewhere classified: Secondary | ICD-10-CM | POA: Diagnosis not present

## 2024-04-26 DIAGNOSIS — M25511 Pain in right shoulder: Secondary | ICD-10-CM | POA: Diagnosis not present

## 2024-05-02 DIAGNOSIS — M7541 Impingement syndrome of right shoulder: Secondary | ICD-10-CM | POA: Diagnosis not present

## 2024-05-02 DIAGNOSIS — M25611 Stiffness of right shoulder, not elsewhere classified: Secondary | ICD-10-CM | POA: Diagnosis not present

## 2024-05-02 DIAGNOSIS — M25511 Pain in right shoulder: Secondary | ICD-10-CM | POA: Diagnosis not present

## 2024-05-02 DIAGNOSIS — M799 Soft tissue disorder, unspecified: Secondary | ICD-10-CM | POA: Diagnosis not present

## 2024-05-03 ENCOUNTER — Telehealth: Payer: Self-pay | Admitting: Neurology

## 2024-05-03 NOTE — Telephone Encounter (Signed)
 Patient cancelled appointment due scheduling conflict. Also wanted to inform since Friday have been trying to cancel appointment. Have been waiting to speak to some one.  I informed the patient we are short staffed, and cancelled his appointment

## 2024-05-04 ENCOUNTER — Ambulatory Visit: Admitting: Neurology

## 2024-06-13 DIAGNOSIS — M25562 Pain in left knee: Secondary | ICD-10-CM | POA: Diagnosis not present

## 2024-06-13 DIAGNOSIS — M25511 Pain in right shoulder: Secondary | ICD-10-CM | POA: Diagnosis not present

## 2024-06-22 DIAGNOSIS — M25511 Pain in right shoulder: Secondary | ICD-10-CM | POA: Diagnosis not present

## 2024-06-22 DIAGNOSIS — M25512 Pain in left shoulder: Secondary | ICD-10-CM | POA: Diagnosis not present

## 2024-08-04 ENCOUNTER — Other Ambulatory Visit: Payer: Self-pay | Admitting: Urology

## 2024-08-04 DIAGNOSIS — R972 Elevated prostate specific antigen [PSA]: Secondary | ICD-10-CM

## 2024-09-14 ENCOUNTER — Other Ambulatory Visit
